# Patient Record
Sex: Female | Born: 1935 | Race: White | Hispanic: No | Marital: Married
Health system: Southern US, Community
[De-identification: ages and names within clinical notes are randomized; demographics above are authoritative.]

## PROBLEM LIST (undated history)

## (undated) DIAGNOSIS — I10 Essential (primary) hypertension: Secondary | ICD-10-CM

## (undated) DIAGNOSIS — I252 Old myocardial infarction: Secondary | ICD-10-CM

## (undated) HISTORY — PX: CARDIAC SURGERY: SHX584

## (undated) HISTORY — PX: PACEMAKER INSERTION: SHX728

---

## 2015-11-01 DIAGNOSIS — R42 Dizziness and giddiness: Secondary | ICD-10-CM | POA: Diagnosis not present

## 2015-11-01 DIAGNOSIS — F0280 Dementia in other diseases classified elsewhere without behavioral disturbance: Secondary | ICD-10-CM | POA: Diagnosis not present

## 2015-11-01 DIAGNOSIS — I10 Essential (primary) hypertension: Secondary | ICD-10-CM | POA: Diagnosis not present

## 2015-11-01 DIAGNOSIS — G300 Alzheimer's disease with early onset: Secondary | ICD-10-CM | POA: Diagnosis not present

## 2015-11-20 DIAGNOSIS — I498 Other specified cardiac arrhythmias: Secondary | ICD-10-CM | POA: Diagnosis not present

## 2015-11-20 DIAGNOSIS — Z45010 Encounter for checking and testing of cardiac pacemaker pulse generator [battery]: Secondary | ICD-10-CM | POA: Diagnosis not present

## 2016-01-30 DIAGNOSIS — G300 Alzheimer's disease with early onset: Secondary | ICD-10-CM | POA: Diagnosis not present

## 2016-01-30 DIAGNOSIS — I10 Essential (primary) hypertension: Secondary | ICD-10-CM | POA: Diagnosis not present

## 2016-01-30 DIAGNOSIS — R05 Cough: Secondary | ICD-10-CM | POA: Diagnosis not present

## 2016-01-30 DIAGNOSIS — F0280 Dementia in other diseases classified elsewhere without behavioral disturbance: Secondary | ICD-10-CM | POA: Diagnosis not present

## 2016-02-12 DIAGNOSIS — I495 Sick sinus syndrome: Secondary | ICD-10-CM | POA: Diagnosis not present

## 2016-02-12 DIAGNOSIS — Z950 Presence of cardiac pacemaker: Secondary | ICD-10-CM | POA: Diagnosis not present

## 2016-02-12 DIAGNOSIS — I10 Essential (primary) hypertension: Secondary | ICD-10-CM | POA: Diagnosis not present

## 2016-02-12 DIAGNOSIS — Z951 Presence of aortocoronary bypass graft: Secondary | ICD-10-CM | POA: Diagnosis not present

## 2016-02-12 DIAGNOSIS — Z952 Presence of prosthetic heart valve: Secondary | ICD-10-CM | POA: Diagnosis not present

## 2016-02-19 DIAGNOSIS — Z950 Presence of cardiac pacemaker: Secondary | ICD-10-CM | POA: Diagnosis not present

## 2016-02-28 DIAGNOSIS — Z952 Presence of prosthetic heart valve: Secondary | ICD-10-CM | POA: Diagnosis not present

## 2016-03-12 DIAGNOSIS — H353131 Nonexudative age-related macular degeneration, bilateral, early dry stage: Secondary | ICD-10-CM | POA: Diagnosis not present

## 2016-03-12 DIAGNOSIS — H524 Presbyopia: Secondary | ICD-10-CM | POA: Diagnosis not present

## 2016-04-30 DIAGNOSIS — F0280 Dementia in other diseases classified elsewhere without behavioral disturbance: Secondary | ICD-10-CM | POA: Diagnosis not present

## 2016-04-30 DIAGNOSIS — G300 Alzheimer's disease with early onset: Secondary | ICD-10-CM | POA: Diagnosis not present

## 2016-04-30 DIAGNOSIS — I10 Essential (primary) hypertension: Secondary | ICD-10-CM | POA: Diagnosis not present

## 2016-04-30 DIAGNOSIS — M7989 Other specified soft tissue disorders: Secondary | ICD-10-CM | POA: Diagnosis not present

## 2016-05-01 DIAGNOSIS — M7989 Other specified soft tissue disorders: Secondary | ICD-10-CM | POA: Diagnosis not present

## 2016-05-03 DIAGNOSIS — M1712 Unilateral primary osteoarthritis, left knee: Secondary | ICD-10-CM | POA: Diagnosis not present

## 2016-05-21 DIAGNOSIS — Z950 Presence of cardiac pacemaker: Secondary | ICD-10-CM | POA: Diagnosis not present

## 2016-08-03 DIAGNOSIS — Z950 Presence of cardiac pacemaker: Secondary | ICD-10-CM | POA: Diagnosis not present

## 2016-08-03 DIAGNOSIS — Z79899 Other long term (current) drug therapy: Secondary | ICD-10-CM | POA: Diagnosis not present

## 2016-08-03 DIAGNOSIS — S72001A Fracture of unspecified part of neck of right femur, initial encounter for closed fracture: Secondary | ICD-10-CM | POA: Diagnosis not present

## 2016-08-03 DIAGNOSIS — W19XXXA Unspecified fall, initial encounter: Secondary | ICD-10-CM | POA: Diagnosis not present

## 2016-08-03 DIAGNOSIS — I480 Paroxysmal atrial fibrillation: Secondary | ICD-10-CM | POA: Diagnosis not present

## 2016-08-03 DIAGNOSIS — G609 Hereditary and idiopathic neuropathy, unspecified: Secondary | ICD-10-CM | POA: Diagnosis not present

## 2016-08-03 DIAGNOSIS — Z951 Presence of aortocoronary bypass graft: Secondary | ICD-10-CM | POA: Diagnosis not present

## 2016-08-03 DIAGNOSIS — I2510 Atherosclerotic heart disease of native coronary artery without angina pectoris: Secondary | ICD-10-CM | POA: Diagnosis not present

## 2016-08-03 DIAGNOSIS — F0390 Unspecified dementia without behavioral disturbance: Secondary | ICD-10-CM | POA: Diagnosis not present

## 2016-08-03 DIAGNOSIS — E871 Hypo-osmolality and hyponatremia: Secondary | ICD-10-CM | POA: Diagnosis not present

## 2016-08-03 DIAGNOSIS — Z952 Presence of prosthetic heart valve: Secondary | ICD-10-CM | POA: Diagnosis not present

## 2016-08-03 DIAGNOSIS — S72142A Displaced intertrochanteric fracture of left femur, initial encounter for closed fracture: Secondary | ICD-10-CM | POA: Diagnosis not present

## 2016-08-03 DIAGNOSIS — E860 Dehydration: Secondary | ICD-10-CM | POA: Diagnosis not present

## 2016-08-03 DIAGNOSIS — K219 Gastro-esophageal reflux disease without esophagitis: Secondary | ICD-10-CM | POA: Diagnosis not present

## 2016-08-03 DIAGNOSIS — S7292XA Unspecified fracture of left femur, initial encounter for closed fracture: Secondary | ICD-10-CM | POA: Diagnosis not present

## 2016-08-03 DIAGNOSIS — M159 Polyosteoarthritis, unspecified: Secondary | ICD-10-CM | POA: Diagnosis not present

## 2016-08-03 DIAGNOSIS — M25552 Pain in left hip: Secondary | ICD-10-CM | POA: Diagnosis not present

## 2016-08-03 DIAGNOSIS — H9193 Unspecified hearing loss, bilateral: Secondary | ICD-10-CM | POA: Diagnosis not present

## 2016-08-03 DIAGNOSIS — I10 Essential (primary) hypertension: Secondary | ICD-10-CM | POA: Diagnosis not present

## 2016-08-03 DIAGNOSIS — Z780 Asymptomatic menopausal state: Secondary | ICD-10-CM | POA: Diagnosis not present

## 2016-08-03 DIAGNOSIS — E785 Hyperlipidemia, unspecified: Secondary | ICD-10-CM | POA: Diagnosis not present

## 2016-08-03 DIAGNOSIS — D62 Acute posthemorrhagic anemia: Secondary | ICD-10-CM | POA: Diagnosis not present

## 2016-08-03 DIAGNOSIS — S0990XA Unspecified injury of head, initial encounter: Secondary | ICD-10-CM | POA: Diagnosis not present

## 2016-08-03 DIAGNOSIS — W01198A Fall on same level from slipping, tripping and stumbling with subsequent striking against other object, initial encounter: Secondary | ICD-10-CM | POA: Diagnosis not present

## 2016-08-03 DIAGNOSIS — I517 Cardiomegaly: Secondary | ICD-10-CM | POA: Diagnosis not present

## 2016-08-04 DIAGNOSIS — I10 Essential (primary) hypertension: Secondary | ICD-10-CM | POA: Diagnosis not present

## 2016-08-04 DIAGNOSIS — D72829 Elevated white blood cell count, unspecified: Secondary | ICD-10-CM | POA: Diagnosis not present

## 2016-08-04 DIAGNOSIS — S7292XA Unspecified fracture of left femur, initial encounter for closed fracture: Secondary | ICD-10-CM | POA: Diagnosis not present

## 2016-08-04 DIAGNOSIS — F0390 Unspecified dementia without behavioral disturbance: Secondary | ICD-10-CM | POA: Diagnosis not present

## 2016-08-04 DIAGNOSIS — E871 Hypo-osmolality and hyponatremia: Secondary | ICD-10-CM | POA: Diagnosis not present

## 2016-08-04 DIAGNOSIS — S72142A Displaced intertrochanteric fracture of left femur, initial encounter for closed fracture: Secondary | ICD-10-CM | POA: Diagnosis not present

## 2016-08-05 DIAGNOSIS — D62 Acute posthemorrhagic anemia: Secondary | ICD-10-CM | POA: Diagnosis not present

## 2016-08-05 DIAGNOSIS — M79605 Pain in left leg: Secondary | ICD-10-CM | POA: Diagnosis not present

## 2016-08-05 DIAGNOSIS — F0390 Unspecified dementia without behavioral disturbance: Secondary | ICD-10-CM | POA: Diagnosis not present

## 2016-08-05 DIAGNOSIS — E871 Hypo-osmolality and hyponatremia: Secondary | ICD-10-CM | POA: Diagnosis not present

## 2016-08-05 DIAGNOSIS — S7292XA Unspecified fracture of left femur, initial encounter for closed fracture: Secondary | ICD-10-CM | POA: Diagnosis not present

## 2016-08-05 DIAGNOSIS — I10 Essential (primary) hypertension: Secondary | ICD-10-CM | POA: Diagnosis not present

## 2016-08-06 DIAGNOSIS — Z950 Presence of cardiac pacemaker: Secondary | ICD-10-CM | POA: Diagnosis not present

## 2016-08-06 DIAGNOSIS — Z4789 Encounter for other orthopedic aftercare: Secondary | ICD-10-CM | POA: Diagnosis not present

## 2016-08-06 DIAGNOSIS — S72142A Displaced intertrochanteric fracture of left femur, initial encounter for closed fracture: Secondary | ICD-10-CM | POA: Diagnosis not present

## 2016-08-06 DIAGNOSIS — E871 Hypo-osmolality and hyponatremia: Secondary | ICD-10-CM | POA: Diagnosis not present

## 2016-08-06 DIAGNOSIS — E860 Dehydration: Secondary | ICD-10-CM | POA: Diagnosis not present

## 2016-08-06 DIAGNOSIS — D72829 Elevated white blood cell count, unspecified: Secondary | ICD-10-CM | POA: Diagnosis not present

## 2016-08-06 DIAGNOSIS — G309 Alzheimer's disease, unspecified: Secondary | ICD-10-CM | POA: Diagnosis not present

## 2016-08-06 DIAGNOSIS — F0280 Dementia in other diseases classified elsewhere without behavioral disturbance: Secondary | ICD-10-CM | POA: Diagnosis not present

## 2016-08-06 DIAGNOSIS — Z951 Presence of aortocoronary bypass graft: Secondary | ICD-10-CM | POA: Diagnosis not present

## 2016-08-06 DIAGNOSIS — K219 Gastro-esophageal reflux disease without esophagitis: Secondary | ICD-10-CM | POA: Diagnosis not present

## 2016-08-06 DIAGNOSIS — D62 Acute posthemorrhagic anemia: Secondary | ICD-10-CM | POA: Diagnosis not present

## 2016-08-06 DIAGNOSIS — Z952 Presence of prosthetic heart valve: Secondary | ICD-10-CM | POA: Diagnosis not present

## 2016-08-06 DIAGNOSIS — I10 Essential (primary) hypertension: Secondary | ICD-10-CM | POA: Diagnosis not present

## 2016-08-06 DIAGNOSIS — S7292XA Unspecified fracture of left femur, initial encounter for closed fracture: Secondary | ICD-10-CM | POA: Diagnosis not present

## 2016-08-07 DIAGNOSIS — I10 Essential (primary) hypertension: Secondary | ICD-10-CM | POA: Diagnosis not present

## 2016-08-07 DIAGNOSIS — D72829 Elevated white blood cell count, unspecified: Secondary | ICD-10-CM | POA: Diagnosis not present

## 2016-08-07 DIAGNOSIS — F0280 Dementia in other diseases classified elsewhere without behavioral disturbance: Secondary | ICD-10-CM | POA: Diagnosis not present

## 2016-08-07 DIAGNOSIS — D62 Acute posthemorrhagic anemia: Secondary | ICD-10-CM | POA: Diagnosis not present

## 2016-08-07 DIAGNOSIS — S7292XA Unspecified fracture of left femur, initial encounter for closed fracture: Secondary | ICD-10-CM | POA: Diagnosis not present

## 2016-08-07 DIAGNOSIS — E871 Hypo-osmolality and hyponatremia: Secondary | ICD-10-CM | POA: Diagnosis not present

## 2016-08-07 DIAGNOSIS — G309 Alzheimer's disease, unspecified: Secondary | ICD-10-CM | POA: Diagnosis not present

## 2016-08-08 DIAGNOSIS — D72829 Elevated white blood cell count, unspecified: Secondary | ICD-10-CM | POA: Diagnosis not present

## 2016-08-08 DIAGNOSIS — G309 Alzheimer's disease, unspecified: Secondary | ICD-10-CM | POA: Diagnosis not present

## 2016-08-08 DIAGNOSIS — E871 Hypo-osmolality and hyponatremia: Secondary | ICD-10-CM | POA: Diagnosis not present

## 2016-08-08 DIAGNOSIS — Z951 Presence of aortocoronary bypass graft: Secondary | ICD-10-CM | POA: Diagnosis not present

## 2016-08-08 DIAGNOSIS — Z952 Presence of prosthetic heart valve: Secondary | ICD-10-CM | POA: Diagnosis not present

## 2016-08-08 DIAGNOSIS — Z950 Presence of cardiac pacemaker: Secondary | ICD-10-CM | POA: Diagnosis not present

## 2016-08-08 DIAGNOSIS — I10 Essential (primary) hypertension: Secondary | ICD-10-CM | POA: Diagnosis not present

## 2016-08-08 DIAGNOSIS — D62 Acute posthemorrhagic anemia: Secondary | ICD-10-CM | POA: Diagnosis not present

## 2016-08-08 DIAGNOSIS — S7292XA Unspecified fracture of left femur, initial encounter for closed fracture: Secondary | ICD-10-CM | POA: Diagnosis not present

## 2016-08-08 DIAGNOSIS — I4891 Unspecified atrial fibrillation: Secondary | ICD-10-CM | POA: Diagnosis not present

## 2016-08-08 DIAGNOSIS — F0280 Dementia in other diseases classified elsewhere without behavioral disturbance: Secondary | ICD-10-CM | POA: Diagnosis not present

## 2016-08-09 DIAGNOSIS — I4891 Unspecified atrial fibrillation: Secondary | ICD-10-CM | POA: Diagnosis not present

## 2016-08-09 DIAGNOSIS — D72829 Elevated white blood cell count, unspecified: Secondary | ICD-10-CM | POA: Diagnosis not present

## 2016-08-09 DIAGNOSIS — K219 Gastro-esophageal reflux disease without esophagitis: Secondary | ICD-10-CM | POA: Diagnosis not present

## 2016-08-09 DIAGNOSIS — Z950 Presence of cardiac pacemaker: Secondary | ICD-10-CM | POA: Diagnosis not present

## 2016-08-09 DIAGNOSIS — I10 Essential (primary) hypertension: Secondary | ICD-10-CM | POA: Diagnosis not present

## 2016-08-09 DIAGNOSIS — G309 Alzheimer's disease, unspecified: Secondary | ICD-10-CM | POA: Diagnosis not present

## 2016-08-09 DIAGNOSIS — D649 Anemia, unspecified: Secondary | ICD-10-CM | POA: Diagnosis not present

## 2016-08-09 DIAGNOSIS — Z952 Presence of prosthetic heart valve: Secondary | ICD-10-CM | POA: Diagnosis not present

## 2016-08-09 DIAGNOSIS — S72002D Fracture of unspecified part of neck of left femur, subsequent encounter for closed fracture with routine healing: Secondary | ICD-10-CM | POA: Diagnosis not present

## 2016-08-09 DIAGNOSIS — E871 Hypo-osmolality and hyponatremia: Secondary | ICD-10-CM | POA: Diagnosis not present

## 2016-08-09 DIAGNOSIS — Z9181 History of falling: Secondary | ICD-10-CM | POA: Diagnosis not present

## 2016-08-09 DIAGNOSIS — F0280 Dementia in other diseases classified elsewhere without behavioral disturbance: Secondary | ICD-10-CM | POA: Diagnosis not present

## 2016-08-09 DIAGNOSIS — S7292XA Unspecified fracture of left femur, initial encounter for closed fracture: Secondary | ICD-10-CM | POA: Diagnosis not present

## 2016-08-09 DIAGNOSIS — I2510 Atherosclerotic heart disease of native coronary artery without angina pectoris: Secondary | ICD-10-CM | POA: Diagnosis not present

## 2016-08-09 DIAGNOSIS — E785 Hyperlipidemia, unspecified: Secondary | ICD-10-CM | POA: Diagnosis not present

## 2016-08-09 DIAGNOSIS — D62 Acute posthemorrhagic anemia: Secondary | ICD-10-CM | POA: Diagnosis not present

## 2016-08-09 DIAGNOSIS — H905 Unspecified sensorineural hearing loss: Secondary | ICD-10-CM | POA: Diagnosis not present

## 2016-08-09 DIAGNOSIS — Z951 Presence of aortocoronary bypass graft: Secondary | ICD-10-CM | POA: Diagnosis not present

## 2016-08-09 DIAGNOSIS — S728X9A Other fracture of unspecified femur, initial encounter for closed fracture: Secondary | ICD-10-CM | POA: Diagnosis not present

## 2016-08-09 DIAGNOSIS — F0390 Unspecified dementia without behavioral disturbance: Secondary | ICD-10-CM | POA: Diagnosis not present

## 2016-08-09 DIAGNOSIS — R42 Dizziness and giddiness: Secondary | ICD-10-CM | POA: Diagnosis not present

## 2016-08-14 DIAGNOSIS — R262 Difficulty in walking, not elsewhere classified: Secondary | ICD-10-CM | POA: Diagnosis not present

## 2016-08-14 DIAGNOSIS — S7292XD Unspecified fracture of left femur, subsequent encounter for closed fracture with routine healing: Secondary | ICD-10-CM | POA: Diagnosis not present

## 2016-08-14 DIAGNOSIS — R42 Dizziness and giddiness: Secondary | ICD-10-CM | POA: Diagnosis not present

## 2016-08-14 DIAGNOSIS — H905 Unspecified sensorineural hearing loss: Secondary | ICD-10-CM | POA: Diagnosis not present

## 2016-08-14 DIAGNOSIS — I2510 Atherosclerotic heart disease of native coronary artery without angina pectoris: Secondary | ICD-10-CM | POA: Diagnosis not present

## 2016-08-14 DIAGNOSIS — Z9181 History of falling: Secondary | ICD-10-CM | POA: Diagnosis not present

## 2016-08-14 DIAGNOSIS — S72002D Fracture of unspecified part of neck of left femur, subsequent encounter for closed fracture with routine healing: Secondary | ICD-10-CM | POA: Diagnosis not present

## 2016-08-14 DIAGNOSIS — K219 Gastro-esophageal reflux disease without esophagitis: Secondary | ICD-10-CM | POA: Diagnosis not present

## 2016-08-14 DIAGNOSIS — F0390 Unspecified dementia without behavioral disturbance: Secondary | ICD-10-CM | POA: Diagnosis not present

## 2016-08-14 DIAGNOSIS — I10 Essential (primary) hypertension: Secondary | ICD-10-CM | POA: Diagnosis not present

## 2016-08-14 DIAGNOSIS — Z951 Presence of aortocoronary bypass graft: Secondary | ICD-10-CM | POA: Diagnosis not present

## 2016-08-14 DIAGNOSIS — Z950 Presence of cardiac pacemaker: Secondary | ICD-10-CM | POA: Diagnosis not present

## 2016-08-14 DIAGNOSIS — E785 Hyperlipidemia, unspecified: Secondary | ICD-10-CM | POA: Diagnosis not present

## 2016-08-19 DIAGNOSIS — Z950 Presence of cardiac pacemaker: Secondary | ICD-10-CM | POA: Diagnosis not present

## 2016-08-19 DIAGNOSIS — Z4789 Encounter for other orthopedic aftercare: Secondary | ICD-10-CM | POA: Diagnosis not present

## 2016-08-20 DIAGNOSIS — M791 Myalgia: Secondary | ICD-10-CM | POA: Diagnosis not present

## 2016-08-20 DIAGNOSIS — R2689 Other abnormalities of gait and mobility: Secondary | ICD-10-CM | POA: Diagnosis not present

## 2016-08-20 DIAGNOSIS — M461 Sacroiliitis, not elsewhere classified: Secondary | ICD-10-CM | POA: Diagnosis not present

## 2016-08-30 DIAGNOSIS — Z9181 History of falling: Secondary | ICD-10-CM | POA: Diagnosis not present

## 2016-08-30 DIAGNOSIS — S72002D Fracture of unspecified part of neck of left femur, subsequent encounter for closed fracture with routine healing: Secondary | ICD-10-CM | POA: Diagnosis not present

## 2016-08-31 DIAGNOSIS — Z79891 Long term (current) use of opiate analgesic: Secondary | ICD-10-CM | POA: Diagnosis not present

## 2016-08-31 DIAGNOSIS — I10 Essential (primary) hypertension: Secondary | ICD-10-CM | POA: Diagnosis not present

## 2016-08-31 DIAGNOSIS — Z9181 History of falling: Secondary | ICD-10-CM | POA: Diagnosis not present

## 2016-08-31 DIAGNOSIS — Z951 Presence of aortocoronary bypass graft: Secondary | ICD-10-CM | POA: Diagnosis not present

## 2016-08-31 DIAGNOSIS — G609 Hereditary and idiopathic neuropathy, unspecified: Secondary | ICD-10-CM | POA: Diagnosis not present

## 2016-08-31 DIAGNOSIS — W19XXXD Unspecified fall, subsequent encounter: Secondary | ICD-10-CM | POA: Diagnosis not present

## 2016-08-31 DIAGNOSIS — I2510 Atherosclerotic heart disease of native coronary artery without angina pectoris: Secondary | ICD-10-CM | POA: Diagnosis not present

## 2016-08-31 DIAGNOSIS — F0390 Unspecified dementia without behavioral disturbance: Secondary | ICD-10-CM | POA: Diagnosis not present

## 2016-08-31 DIAGNOSIS — I480 Paroxysmal atrial fibrillation: Secondary | ICD-10-CM | POA: Diagnosis not present

## 2016-08-31 DIAGNOSIS — M150 Primary generalized (osteo)arthritis: Secondary | ICD-10-CM | POA: Diagnosis not present

## 2016-08-31 DIAGNOSIS — H9193 Unspecified hearing loss, bilateral: Secondary | ICD-10-CM | POA: Diagnosis not present

## 2016-08-31 DIAGNOSIS — S72142D Displaced intertrochanteric fracture of left femur, subsequent encounter for closed fracture with routine healing: Secondary | ICD-10-CM | POA: Diagnosis not present

## 2016-09-09 DIAGNOSIS — Z951 Presence of aortocoronary bypass graft: Secondary | ICD-10-CM | POA: Diagnosis not present

## 2016-09-09 DIAGNOSIS — G609 Hereditary and idiopathic neuropathy, unspecified: Secondary | ICD-10-CM | POA: Diagnosis not present

## 2016-09-09 DIAGNOSIS — I480 Paroxysmal atrial fibrillation: Secondary | ICD-10-CM | POA: Diagnosis not present

## 2016-09-09 DIAGNOSIS — I2510 Atherosclerotic heart disease of native coronary artery without angina pectoris: Secondary | ICD-10-CM | POA: Diagnosis not present

## 2016-09-09 DIAGNOSIS — Z9181 History of falling: Secondary | ICD-10-CM | POA: Diagnosis not present

## 2016-09-09 DIAGNOSIS — W19XXXD Unspecified fall, subsequent encounter: Secondary | ICD-10-CM | POA: Diagnosis not present

## 2016-09-09 DIAGNOSIS — I10 Essential (primary) hypertension: Secondary | ICD-10-CM | POA: Diagnosis not present

## 2016-09-09 DIAGNOSIS — Z79891 Long term (current) use of opiate analgesic: Secondary | ICD-10-CM | POA: Diagnosis not present

## 2016-09-09 DIAGNOSIS — F0390 Unspecified dementia without behavioral disturbance: Secondary | ICD-10-CM | POA: Diagnosis not present

## 2016-09-09 DIAGNOSIS — M150 Primary generalized (osteo)arthritis: Secondary | ICD-10-CM | POA: Diagnosis not present

## 2016-09-09 DIAGNOSIS — H9193 Unspecified hearing loss, bilateral: Secondary | ICD-10-CM | POA: Diagnosis not present

## 2016-09-09 DIAGNOSIS — S72142D Displaced intertrochanteric fracture of left femur, subsequent encounter for closed fracture with routine healing: Secondary | ICD-10-CM | POA: Diagnosis not present

## 2016-09-11 DIAGNOSIS — I10 Essential (primary) hypertension: Secondary | ICD-10-CM | POA: Diagnosis not present

## 2016-09-11 DIAGNOSIS — I480 Paroxysmal atrial fibrillation: Secondary | ICD-10-CM | POA: Diagnosis not present

## 2016-09-11 DIAGNOSIS — S72142A Displaced intertrochanteric fracture of left femur, initial encounter for closed fracture: Secondary | ICD-10-CM | POA: Diagnosis not present

## 2016-09-11 DIAGNOSIS — D62 Acute posthemorrhagic anemia: Secondary | ICD-10-CM | POA: Diagnosis not present

## 2016-09-11 DIAGNOSIS — Z23 Encounter for immunization: Secondary | ICD-10-CM | POA: Diagnosis not present

## 2016-09-18 DIAGNOSIS — Z4789 Encounter for other orthopedic aftercare: Secondary | ICD-10-CM | POA: Diagnosis not present

## 2016-09-20 DIAGNOSIS — I10 Essential (primary) hypertension: Secondary | ICD-10-CM | POA: Diagnosis not present

## 2016-09-20 DIAGNOSIS — M150 Primary generalized (osteo)arthritis: Secondary | ICD-10-CM | POA: Diagnosis not present

## 2016-09-20 DIAGNOSIS — S72142D Displaced intertrochanteric fracture of left femur, subsequent encounter for closed fracture with routine healing: Secondary | ICD-10-CM | POA: Diagnosis not present

## 2016-09-20 DIAGNOSIS — Z951 Presence of aortocoronary bypass graft: Secondary | ICD-10-CM | POA: Diagnosis not present

## 2016-09-20 DIAGNOSIS — W19XXXD Unspecified fall, subsequent encounter: Secondary | ICD-10-CM | POA: Diagnosis not present

## 2016-09-20 DIAGNOSIS — F0390 Unspecified dementia without behavioral disturbance: Secondary | ICD-10-CM | POA: Diagnosis not present

## 2016-09-20 DIAGNOSIS — Z9181 History of falling: Secondary | ICD-10-CM | POA: Diagnosis not present

## 2016-09-20 DIAGNOSIS — H9193 Unspecified hearing loss, bilateral: Secondary | ICD-10-CM | POA: Diagnosis not present

## 2016-09-20 DIAGNOSIS — Z79891 Long term (current) use of opiate analgesic: Secondary | ICD-10-CM | POA: Diagnosis not present

## 2016-09-20 DIAGNOSIS — G609 Hereditary and idiopathic neuropathy, unspecified: Secondary | ICD-10-CM | POA: Diagnosis not present

## 2016-09-20 DIAGNOSIS — I2510 Atherosclerotic heart disease of native coronary artery without angina pectoris: Secondary | ICD-10-CM | POA: Diagnosis not present

## 2016-09-20 DIAGNOSIS — I480 Paroxysmal atrial fibrillation: Secondary | ICD-10-CM | POA: Diagnosis not present

## 2016-09-23 DIAGNOSIS — I2510 Atherosclerotic heart disease of native coronary artery without angina pectoris: Secondary | ICD-10-CM | POA: Diagnosis not present

## 2016-09-23 DIAGNOSIS — I10 Essential (primary) hypertension: Secondary | ICD-10-CM | POA: Diagnosis not present

## 2016-09-23 DIAGNOSIS — H9193 Unspecified hearing loss, bilateral: Secondary | ICD-10-CM | POA: Diagnosis not present

## 2016-09-23 DIAGNOSIS — Z9181 History of falling: Secondary | ICD-10-CM | POA: Diagnosis not present

## 2016-09-23 DIAGNOSIS — S72142D Displaced intertrochanteric fracture of left femur, subsequent encounter for closed fracture with routine healing: Secondary | ICD-10-CM | POA: Diagnosis not present

## 2016-09-23 DIAGNOSIS — F0390 Unspecified dementia without behavioral disturbance: Secondary | ICD-10-CM | POA: Diagnosis not present

## 2016-09-23 DIAGNOSIS — W19XXXD Unspecified fall, subsequent encounter: Secondary | ICD-10-CM | POA: Diagnosis not present

## 2016-09-23 DIAGNOSIS — I480 Paroxysmal atrial fibrillation: Secondary | ICD-10-CM | POA: Diagnosis not present

## 2016-09-23 DIAGNOSIS — M150 Primary generalized (osteo)arthritis: Secondary | ICD-10-CM | POA: Diagnosis not present

## 2016-09-23 DIAGNOSIS — Z79891 Long term (current) use of opiate analgesic: Secondary | ICD-10-CM | POA: Diagnosis not present

## 2016-09-23 DIAGNOSIS — G609 Hereditary and idiopathic neuropathy, unspecified: Secondary | ICD-10-CM | POA: Diagnosis not present

## 2016-09-23 DIAGNOSIS — Z951 Presence of aortocoronary bypass graft: Secondary | ICD-10-CM | POA: Diagnosis not present

## 2016-10-01 DIAGNOSIS — Z9181 History of falling: Secondary | ICD-10-CM | POA: Diagnosis not present

## 2016-10-01 DIAGNOSIS — H9193 Unspecified hearing loss, bilateral: Secondary | ICD-10-CM | POA: Diagnosis not present

## 2016-10-01 DIAGNOSIS — G609 Hereditary and idiopathic neuropathy, unspecified: Secondary | ICD-10-CM | POA: Diagnosis not present

## 2016-10-01 DIAGNOSIS — I10 Essential (primary) hypertension: Secondary | ICD-10-CM | POA: Diagnosis not present

## 2016-10-01 DIAGNOSIS — S72142D Displaced intertrochanteric fracture of left femur, subsequent encounter for closed fracture with routine healing: Secondary | ICD-10-CM | POA: Diagnosis not present

## 2016-10-01 DIAGNOSIS — Z79891 Long term (current) use of opiate analgesic: Secondary | ICD-10-CM | POA: Diagnosis not present

## 2016-10-01 DIAGNOSIS — W19XXXD Unspecified fall, subsequent encounter: Secondary | ICD-10-CM | POA: Diagnosis not present

## 2016-10-01 DIAGNOSIS — I2510 Atherosclerotic heart disease of native coronary artery without angina pectoris: Secondary | ICD-10-CM | POA: Diagnosis not present

## 2016-10-01 DIAGNOSIS — Z951 Presence of aortocoronary bypass graft: Secondary | ICD-10-CM | POA: Diagnosis not present

## 2016-10-01 DIAGNOSIS — F0390 Unspecified dementia without behavioral disturbance: Secondary | ICD-10-CM | POA: Diagnosis not present

## 2016-10-01 DIAGNOSIS — I480 Paroxysmal atrial fibrillation: Secondary | ICD-10-CM | POA: Diagnosis not present

## 2016-10-01 DIAGNOSIS — M150 Primary generalized (osteo)arthritis: Secondary | ICD-10-CM | POA: Diagnosis not present

## 2016-10-02 DIAGNOSIS — S72142D Displaced intertrochanteric fracture of left femur, subsequent encounter for closed fracture with routine healing: Secondary | ICD-10-CM | POA: Diagnosis not present

## 2016-10-02 DIAGNOSIS — M150 Primary generalized (osteo)arthritis: Secondary | ICD-10-CM | POA: Diagnosis not present

## 2016-10-02 DIAGNOSIS — F0390 Unspecified dementia without behavioral disturbance: Secondary | ICD-10-CM | POA: Diagnosis not present

## 2016-10-02 DIAGNOSIS — I10 Essential (primary) hypertension: Secondary | ICD-10-CM | POA: Diagnosis not present

## 2016-10-02 DIAGNOSIS — I480 Paroxysmal atrial fibrillation: Secondary | ICD-10-CM | POA: Diagnosis not present

## 2016-10-02 DIAGNOSIS — G609 Hereditary and idiopathic neuropathy, unspecified: Secondary | ICD-10-CM | POA: Diagnosis not present

## 2016-10-10 DIAGNOSIS — I480 Paroxysmal atrial fibrillation: Secondary | ICD-10-CM | POA: Diagnosis not present

## 2016-10-10 DIAGNOSIS — Z9181 History of falling: Secondary | ICD-10-CM | POA: Diagnosis not present

## 2016-10-10 DIAGNOSIS — I10 Essential (primary) hypertension: Secondary | ICD-10-CM | POA: Diagnosis not present

## 2016-10-10 DIAGNOSIS — I2510 Atherosclerotic heart disease of native coronary artery without angina pectoris: Secondary | ICD-10-CM | POA: Diagnosis not present

## 2016-10-10 DIAGNOSIS — M150 Primary generalized (osteo)arthritis: Secondary | ICD-10-CM | POA: Diagnosis not present

## 2016-10-10 DIAGNOSIS — S72142D Displaced intertrochanteric fracture of left femur, subsequent encounter for closed fracture with routine healing: Secondary | ICD-10-CM | POA: Diagnosis not present

## 2016-10-10 DIAGNOSIS — Z951 Presence of aortocoronary bypass graft: Secondary | ICD-10-CM | POA: Diagnosis not present

## 2016-10-10 DIAGNOSIS — H9193 Unspecified hearing loss, bilateral: Secondary | ICD-10-CM | POA: Diagnosis not present

## 2016-10-10 DIAGNOSIS — G609 Hereditary and idiopathic neuropathy, unspecified: Secondary | ICD-10-CM | POA: Diagnosis not present

## 2016-10-10 DIAGNOSIS — W19XXXD Unspecified fall, subsequent encounter: Secondary | ICD-10-CM | POA: Diagnosis not present

## 2016-10-10 DIAGNOSIS — Z79891 Long term (current) use of opiate analgesic: Secondary | ICD-10-CM | POA: Diagnosis not present

## 2016-10-10 DIAGNOSIS — F0390 Unspecified dementia without behavioral disturbance: Secondary | ICD-10-CM | POA: Diagnosis not present

## 2016-10-16 DIAGNOSIS — S72142D Displaced intertrochanteric fracture of left femur, subsequent encounter for closed fracture with routine healing: Secondary | ICD-10-CM | POA: Diagnosis not present

## 2016-10-16 DIAGNOSIS — Z951 Presence of aortocoronary bypass graft: Secondary | ICD-10-CM | POA: Diagnosis not present

## 2016-10-16 DIAGNOSIS — Z9181 History of falling: Secondary | ICD-10-CM | POA: Diagnosis not present

## 2016-10-16 DIAGNOSIS — I2510 Atherosclerotic heart disease of native coronary artery without angina pectoris: Secondary | ICD-10-CM | POA: Diagnosis not present

## 2016-10-16 DIAGNOSIS — F0390 Unspecified dementia without behavioral disturbance: Secondary | ICD-10-CM | POA: Diagnosis not present

## 2016-10-16 DIAGNOSIS — I480 Paroxysmal atrial fibrillation: Secondary | ICD-10-CM | POA: Diagnosis not present

## 2016-10-16 DIAGNOSIS — I10 Essential (primary) hypertension: Secondary | ICD-10-CM | POA: Diagnosis not present

## 2016-10-16 DIAGNOSIS — H9193 Unspecified hearing loss, bilateral: Secondary | ICD-10-CM | POA: Diagnosis not present

## 2016-10-16 DIAGNOSIS — G609 Hereditary and idiopathic neuropathy, unspecified: Secondary | ICD-10-CM | POA: Diagnosis not present

## 2016-10-16 DIAGNOSIS — M150 Primary generalized (osteo)arthritis: Secondary | ICD-10-CM | POA: Diagnosis not present

## 2016-10-16 DIAGNOSIS — Z79891 Long term (current) use of opiate analgesic: Secondary | ICD-10-CM | POA: Diagnosis not present

## 2016-10-16 DIAGNOSIS — W19XXXD Unspecified fall, subsequent encounter: Secondary | ICD-10-CM | POA: Diagnosis not present

## 2016-10-18 DIAGNOSIS — S72142D Displaced intertrochanteric fracture of left femur, subsequent encounter for closed fracture with routine healing: Secondary | ICD-10-CM | POA: Diagnosis not present

## 2016-10-18 DIAGNOSIS — I10 Essential (primary) hypertension: Secondary | ICD-10-CM | POA: Diagnosis not present

## 2016-10-18 DIAGNOSIS — M150 Primary generalized (osteo)arthritis: Secondary | ICD-10-CM | POA: Diagnosis not present

## 2016-10-18 DIAGNOSIS — G609 Hereditary and idiopathic neuropathy, unspecified: Secondary | ICD-10-CM | POA: Diagnosis not present

## 2016-10-18 DIAGNOSIS — F0390 Unspecified dementia without behavioral disturbance: Secondary | ICD-10-CM | POA: Diagnosis not present

## 2016-10-18 DIAGNOSIS — I480 Paroxysmal atrial fibrillation: Secondary | ICD-10-CM | POA: Diagnosis not present

## 2016-10-21 DIAGNOSIS — I2510 Atherosclerotic heart disease of native coronary artery without angina pectoris: Secondary | ICD-10-CM | POA: Diagnosis not present

## 2016-10-21 DIAGNOSIS — H9193 Unspecified hearing loss, bilateral: Secondary | ICD-10-CM | POA: Diagnosis not present

## 2016-10-21 DIAGNOSIS — F0390 Unspecified dementia without behavioral disturbance: Secondary | ICD-10-CM | POA: Diagnosis not present

## 2016-10-21 DIAGNOSIS — Z79891 Long term (current) use of opiate analgesic: Secondary | ICD-10-CM | POA: Diagnosis not present

## 2016-10-21 DIAGNOSIS — G609 Hereditary and idiopathic neuropathy, unspecified: Secondary | ICD-10-CM | POA: Diagnosis not present

## 2016-10-21 DIAGNOSIS — I480 Paroxysmal atrial fibrillation: Secondary | ICD-10-CM | POA: Diagnosis not present

## 2016-10-21 DIAGNOSIS — I10 Essential (primary) hypertension: Secondary | ICD-10-CM | POA: Diagnosis not present

## 2016-10-21 DIAGNOSIS — M150 Primary generalized (osteo)arthritis: Secondary | ICD-10-CM | POA: Diagnosis not present

## 2016-10-21 DIAGNOSIS — S72142D Displaced intertrochanteric fracture of left femur, subsequent encounter for closed fracture with routine healing: Secondary | ICD-10-CM | POA: Diagnosis not present

## 2016-10-21 DIAGNOSIS — Z9181 History of falling: Secondary | ICD-10-CM | POA: Diagnosis not present

## 2016-10-21 DIAGNOSIS — Z951 Presence of aortocoronary bypass graft: Secondary | ICD-10-CM | POA: Diagnosis not present

## 2016-10-21 DIAGNOSIS — W19XXXD Unspecified fall, subsequent encounter: Secondary | ICD-10-CM | POA: Diagnosis not present

## 2016-11-18 DIAGNOSIS — Z950 Presence of cardiac pacemaker: Secondary | ICD-10-CM | POA: Diagnosis not present

## 2016-12-16 DIAGNOSIS — I495 Sick sinus syndrome: Secondary | ICD-10-CM | POA: Diagnosis not present

## 2016-12-16 DIAGNOSIS — F0280 Dementia in other diseases classified elsewhere without behavioral disturbance: Secondary | ICD-10-CM | POA: Diagnosis not present

## 2016-12-16 DIAGNOSIS — G609 Hereditary and idiopathic neuropathy, unspecified: Secondary | ICD-10-CM | POA: Diagnosis not present

## 2016-12-16 DIAGNOSIS — I10 Essential (primary) hypertension: Secondary | ICD-10-CM | POA: Diagnosis not present

## 2016-12-16 DIAGNOSIS — G300 Alzheimer's disease with early onset: Secondary | ICD-10-CM | POA: Diagnosis not present

## 2016-12-16 DIAGNOSIS — Z950 Presence of cardiac pacemaker: Secondary | ICD-10-CM | POA: Diagnosis not present

## 2016-12-16 DIAGNOSIS — I480 Paroxysmal atrial fibrillation: Secondary | ICD-10-CM | POA: Diagnosis not present

## 2016-12-16 DIAGNOSIS — E782 Mixed hyperlipidemia: Secondary | ICD-10-CM | POA: Diagnosis not present

## 2016-12-16 DIAGNOSIS — Z0000 Encounter for general adult medical examination without abnormal findings: Secondary | ICD-10-CM | POA: Diagnosis not present

## 2016-12-16 DIAGNOSIS — R300 Dysuria: Secondary | ICD-10-CM | POA: Diagnosis not present

## 2016-12-25 DIAGNOSIS — R300 Dysuria: Secondary | ICD-10-CM | POA: Diagnosis not present

## 2017-01-29 DIAGNOSIS — H353131 Nonexudative age-related macular degeneration, bilateral, early dry stage: Secondary | ICD-10-CM | POA: Diagnosis not present

## 2017-01-29 DIAGNOSIS — H5213 Myopia, bilateral: Secondary | ICD-10-CM | POA: Diagnosis not present

## 2017-02-12 DIAGNOSIS — Z951 Presence of aortocoronary bypass graft: Secondary | ICD-10-CM | POA: Diagnosis not present

## 2017-02-12 DIAGNOSIS — I10 Essential (primary) hypertension: Secondary | ICD-10-CM | POA: Diagnosis not present

## 2017-02-12 DIAGNOSIS — Z8679 Personal history of other diseases of the circulatory system: Secondary | ICD-10-CM | POA: Diagnosis not present

## 2017-02-12 DIAGNOSIS — Z952 Presence of prosthetic heart valve: Secondary | ICD-10-CM | POA: Diagnosis not present

## 2017-02-12 DIAGNOSIS — I495 Sick sinus syndrome: Secondary | ICD-10-CM | POA: Diagnosis not present

## 2017-02-12 DIAGNOSIS — Z9889 Other specified postprocedural states: Secondary | ICD-10-CM | POA: Diagnosis not present

## 2017-02-12 DIAGNOSIS — Z950 Presence of cardiac pacemaker: Secondary | ICD-10-CM | POA: Diagnosis not present

## 2017-02-12 DIAGNOSIS — E785 Hyperlipidemia, unspecified: Secondary | ICD-10-CM | POA: Diagnosis not present

## 2017-02-18 DIAGNOSIS — H906 Mixed conductive and sensorineural hearing loss, bilateral: Secondary | ICD-10-CM | POA: Diagnosis not present

## 2017-02-18 DIAGNOSIS — G300 Alzheimer's disease with early onset: Secondary | ICD-10-CM | POA: Diagnosis not present

## 2017-02-18 DIAGNOSIS — M545 Low back pain: Secondary | ICD-10-CM | POA: Diagnosis not present

## 2017-02-18 DIAGNOSIS — M159 Polyosteoarthritis, unspecified: Secondary | ICD-10-CM | POA: Diagnosis not present

## 2017-02-18 DIAGNOSIS — F0280 Dementia in other diseases classified elsewhere without behavioral disturbance: Secondary | ICD-10-CM | POA: Diagnosis not present

## 2017-03-24 DIAGNOSIS — H9202 Otalgia, left ear: Secondary | ICD-10-CM | POA: Diagnosis not present

## 2017-03-24 DIAGNOSIS — H903 Sensorineural hearing loss, bilateral: Secondary | ICD-10-CM | POA: Diagnosis not present

## 2017-06-19 DIAGNOSIS — Z23 Encounter for immunization: Secondary | ICD-10-CM | POA: Diagnosis not present

## 2017-06-19 DIAGNOSIS — F0280 Dementia in other diseases classified elsewhere without behavioral disturbance: Secondary | ICD-10-CM | POA: Diagnosis not present

## 2017-06-19 DIAGNOSIS — I10 Essential (primary) hypertension: Secondary | ICD-10-CM | POA: Diagnosis not present

## 2017-06-19 DIAGNOSIS — G300 Alzheimer's disease with early onset: Secondary | ICD-10-CM | POA: Diagnosis not present

## 2017-08-01 DIAGNOSIS — I480 Paroxysmal atrial fibrillation: Secondary | ICD-10-CM | POA: Diagnosis not present

## 2017-08-01 DIAGNOSIS — I10 Essential (primary) hypertension: Secondary | ICD-10-CM | POA: Diagnosis not present

## 2017-08-01 DIAGNOSIS — R269 Unspecified abnormalities of gait and mobility: Secondary | ICD-10-CM | POA: Diagnosis not present

## 2017-08-01 DIAGNOSIS — M159 Polyosteoarthritis, unspecified: Secondary | ICD-10-CM | POA: Diagnosis not present

## 2017-08-01 DIAGNOSIS — G609 Hereditary and idiopathic neuropathy, unspecified: Secondary | ICD-10-CM | POA: Diagnosis not present

## 2017-08-29 DIAGNOSIS — Z950 Presence of cardiac pacemaker: Secondary | ICD-10-CM | POA: Diagnosis not present

## 2017-10-16 DIAGNOSIS — Z952 Presence of prosthetic heart valve: Secondary | ICD-10-CM | POA: Diagnosis not present

## 2017-10-16 DIAGNOSIS — I25810 Atherosclerosis of coronary artery bypass graft(s) without angina pectoris: Secondary | ICD-10-CM | POA: Diagnosis not present

## 2017-10-16 DIAGNOSIS — Z950 Presence of cardiac pacemaker: Secondary | ICD-10-CM | POA: Diagnosis not present

## 2017-10-16 DIAGNOSIS — E782 Mixed hyperlipidemia: Secondary | ICD-10-CM | POA: Diagnosis not present

## 2017-12-17 DIAGNOSIS — Z950 Presence of cardiac pacemaker: Secondary | ICD-10-CM | POA: Diagnosis not present

## 2017-12-22 DIAGNOSIS — I10 Essential (primary) hypertension: Secondary | ICD-10-CM | POA: Diagnosis not present

## 2017-12-22 DIAGNOSIS — E782 Mixed hyperlipidemia: Secondary | ICD-10-CM | POA: Diagnosis not present

## 2017-12-22 DIAGNOSIS — Z0000 Encounter for general adult medical examination without abnormal findings: Secondary | ICD-10-CM | POA: Diagnosis not present

## 2017-12-22 DIAGNOSIS — I495 Sick sinus syndrome: Secondary | ICD-10-CM | POA: Diagnosis not present

## 2017-12-22 DIAGNOSIS — N3001 Acute cystitis with hematuria: Secondary | ICD-10-CM | POA: Diagnosis not present

## 2017-12-22 DIAGNOSIS — I480 Paroxysmal atrial fibrillation: Secondary | ICD-10-CM | POA: Diagnosis not present

## 2018-02-27 DIAGNOSIS — J3489 Other specified disorders of nose and nasal sinuses: Secondary | ICD-10-CM | POA: Diagnosis not present

## 2018-02-27 DIAGNOSIS — H903 Sensorineural hearing loss, bilateral: Secondary | ICD-10-CM | POA: Diagnosis not present

## 2018-03-18 DIAGNOSIS — Z45010 Encounter for checking and testing of cardiac pacemaker pulse generator [battery]: Secondary | ICD-10-CM | POA: Diagnosis not present

## 2018-03-18 DIAGNOSIS — Z950 Presence of cardiac pacemaker: Secondary | ICD-10-CM | POA: Diagnosis not present

## 2018-04-15 DIAGNOSIS — H31012 Macula scars of posterior pole (postinflammatory) (post-traumatic), left eye: Secondary | ICD-10-CM | POA: Diagnosis not present

## 2018-04-15 DIAGNOSIS — H52203 Unspecified astigmatism, bilateral: Secondary | ICD-10-CM | POA: Diagnosis not present

## 2018-04-15 DIAGNOSIS — D3131 Benign neoplasm of right choroid: Secondary | ICD-10-CM | POA: Diagnosis not present

## 2018-04-15 DIAGNOSIS — H524 Presbyopia: Secondary | ICD-10-CM | POA: Diagnosis not present

## 2018-04-27 DIAGNOSIS — I10 Essential (primary) hypertension: Secondary | ICD-10-CM | POA: Diagnosis not present

## 2018-04-27 DIAGNOSIS — I480 Paroxysmal atrial fibrillation: Secondary | ICD-10-CM | POA: Diagnosis not present

## 2018-04-27 DIAGNOSIS — E782 Mixed hyperlipidemia: Secondary | ICD-10-CM | POA: Diagnosis not present

## 2018-04-27 DIAGNOSIS — Z952 Presence of prosthetic heart valve: Secondary | ICD-10-CM | POA: Diagnosis not present

## 2018-04-27 DIAGNOSIS — I495 Sick sinus syndrome: Secondary | ICD-10-CM | POA: Diagnosis not present

## 2018-04-27 DIAGNOSIS — Z951 Presence of aortocoronary bypass graft: Secondary | ICD-10-CM | POA: Diagnosis not present

## 2018-04-27 DIAGNOSIS — Z950 Presence of cardiac pacemaker: Secondary | ICD-10-CM | POA: Diagnosis not present

## 2018-04-27 DIAGNOSIS — I25810 Atherosclerosis of coronary artery bypass graft(s) without angina pectoris: Secondary | ICD-10-CM | POA: Diagnosis not present

## 2018-05-12 DIAGNOSIS — Z952 Presence of prosthetic heart valve: Secondary | ICD-10-CM | POA: Diagnosis not present

## 2018-05-26 DIAGNOSIS — Z950 Presence of cardiac pacemaker: Secondary | ICD-10-CM | POA: Diagnosis not present

## 2018-05-26 DIAGNOSIS — I495 Sick sinus syndrome: Secondary | ICD-10-CM | POA: Diagnosis not present

## 2018-06-23 DIAGNOSIS — H903 Sensorineural hearing loss, bilateral: Secondary | ICD-10-CM | POA: Diagnosis not present

## 2018-06-23 DIAGNOSIS — H6123 Impacted cerumen, bilateral: Secondary | ICD-10-CM | POA: Diagnosis not present

## 2018-06-30 DIAGNOSIS — E782 Mixed hyperlipidemia: Secondary | ICD-10-CM | POA: Diagnosis not present

## 2018-06-30 DIAGNOSIS — G300 Alzheimer's disease with early onset: Secondary | ICD-10-CM | POA: Diagnosis not present

## 2018-06-30 DIAGNOSIS — I480 Paroxysmal atrial fibrillation: Secondary | ICD-10-CM | POA: Diagnosis not present

## 2018-06-30 DIAGNOSIS — I10 Essential (primary) hypertension: Secondary | ICD-10-CM | POA: Diagnosis not present

## 2018-06-30 DIAGNOSIS — F0280 Dementia in other diseases classified elsewhere without behavioral disturbance: Secondary | ICD-10-CM | POA: Diagnosis not present

## 2018-07-20 DIAGNOSIS — R310 Gross hematuria: Secondary | ICD-10-CM | POA: Diagnosis not present

## 2018-08-25 DIAGNOSIS — Z950 Presence of cardiac pacemaker: Secondary | ICD-10-CM | POA: Diagnosis not present

## 2018-10-15 DIAGNOSIS — H903 Sensorineural hearing loss, bilateral: Secondary | ICD-10-CM | POA: Diagnosis not present

## 2018-10-15 DIAGNOSIS — H6121 Impacted cerumen, right ear: Secondary | ICD-10-CM | POA: Diagnosis not present

## 2018-10-15 DIAGNOSIS — J3489 Other specified disorders of nose and nasal sinuses: Secondary | ICD-10-CM | POA: Diagnosis not present

## 2018-10-15 DIAGNOSIS — Z974 Presence of external hearing-aid: Secondary | ICD-10-CM | POA: Diagnosis not present

## 2018-10-15 DIAGNOSIS — H6122 Impacted cerumen, left ear: Secondary | ICD-10-CM | POA: Diagnosis not present

## 2018-10-21 DIAGNOSIS — H353132 Nonexudative age-related macular degeneration, bilateral, intermediate dry stage: Secondary | ICD-10-CM | POA: Diagnosis not present

## 2018-10-21 DIAGNOSIS — H524 Presbyopia: Secondary | ICD-10-CM | POA: Diagnosis not present

## 2018-10-21 DIAGNOSIS — H3554 Dystrophies primarily involving the retinal pigment epithelium: Secondary | ICD-10-CM | POA: Diagnosis not present

## 2018-10-21 DIAGNOSIS — H35361 Drusen (degenerative) of macula, right eye: Secondary | ICD-10-CM | POA: Diagnosis not present

## 2018-10-21 DIAGNOSIS — H35433 Paving stone degeneration of retina, bilateral: Secondary | ICD-10-CM | POA: Diagnosis not present

## 2018-10-21 DIAGNOSIS — H31012 Macula scars of posterior pole (postinflammatory) (post-traumatic), left eye: Secondary | ICD-10-CM | POA: Diagnosis not present

## 2018-10-21 DIAGNOSIS — Z961 Presence of intraocular lens: Secondary | ICD-10-CM | POA: Diagnosis not present

## 2018-10-21 DIAGNOSIS — H5212 Myopia, left eye: Secondary | ICD-10-CM | POA: Diagnosis not present

## 2018-10-21 DIAGNOSIS — H43813 Vitreous degeneration, bilateral: Secondary | ICD-10-CM | POA: Diagnosis not present

## 2018-10-21 DIAGNOSIS — D3131 Benign neoplasm of right choroid: Secondary | ICD-10-CM | POA: Diagnosis not present

## 2018-10-21 DIAGNOSIS — H52203 Unspecified astigmatism, bilateral: Secondary | ICD-10-CM | POA: Diagnosis not present

## 2018-10-29 DIAGNOSIS — Z952 Presence of prosthetic heart valve: Secondary | ICD-10-CM | POA: Diagnosis not present

## 2018-10-29 DIAGNOSIS — Z950 Presence of cardiac pacemaker: Secondary | ICD-10-CM | POA: Diagnosis not present

## 2018-10-29 DIAGNOSIS — I480 Paroxysmal atrial fibrillation: Secondary | ICD-10-CM | POA: Diagnosis not present

## 2018-10-29 DIAGNOSIS — I10 Essential (primary) hypertension: Secondary | ICD-10-CM | POA: Diagnosis not present

## 2018-10-29 DIAGNOSIS — I25810 Atherosclerosis of coronary artery bypass graft(s) without angina pectoris: Secondary | ICD-10-CM | POA: Diagnosis not present

## 2018-10-29 DIAGNOSIS — E782 Mixed hyperlipidemia: Secondary | ICD-10-CM | POA: Diagnosis not present

## 2018-11-23 DIAGNOSIS — Z950 Presence of cardiac pacemaker: Secondary | ICD-10-CM | POA: Diagnosis not present

## 2018-12-29 DIAGNOSIS — G300 Alzheimer's disease with early onset: Secondary | ICD-10-CM | POA: Diagnosis not present

## 2018-12-29 DIAGNOSIS — Z23 Encounter for immunization: Secondary | ICD-10-CM | POA: Diagnosis not present

## 2018-12-29 DIAGNOSIS — Z0000 Encounter for general adult medical examination without abnormal findings: Secondary | ICD-10-CM | POA: Diagnosis not present

## 2018-12-29 DIAGNOSIS — I10 Essential (primary) hypertension: Secondary | ICD-10-CM | POA: Diagnosis not present

## 2018-12-29 DIAGNOSIS — Z950 Presence of cardiac pacemaker: Secondary | ICD-10-CM | POA: Diagnosis not present

## 2018-12-29 DIAGNOSIS — Z952 Presence of prosthetic heart valve: Secondary | ICD-10-CM | POA: Diagnosis not present

## 2018-12-29 DIAGNOSIS — G609 Hereditary and idiopathic neuropathy, unspecified: Secondary | ICD-10-CM | POA: Diagnosis not present

## 2018-12-29 DIAGNOSIS — F0280 Dementia in other diseases classified elsewhere without behavioral disturbance: Secondary | ICD-10-CM | POA: Diagnosis not present

## 2018-12-29 DIAGNOSIS — I480 Paroxysmal atrial fibrillation: Secondary | ICD-10-CM | POA: Diagnosis not present

## 2018-12-29 DIAGNOSIS — Z951 Presence of aortocoronary bypass graft: Secondary | ICD-10-CM | POA: Diagnosis not present

## 2018-12-29 DIAGNOSIS — I495 Sick sinus syndrome: Secondary | ICD-10-CM | POA: Diagnosis not present

## 2018-12-29 DIAGNOSIS — E782 Mixed hyperlipidemia: Secondary | ICD-10-CM | POA: Diagnosis not present

## 2019-03-29 DIAGNOSIS — H903 Sensorineural hearing loss, bilateral: Secondary | ICD-10-CM | POA: Diagnosis not present

## 2019-04-19 DIAGNOSIS — K625 Hemorrhage of anus and rectum: Secondary | ICD-10-CM | POA: Diagnosis not present

## 2019-04-19 DIAGNOSIS — S51812A Laceration without foreign body of left forearm, initial encounter: Secondary | ICD-10-CM | POA: Diagnosis not present

## 2019-04-19 DIAGNOSIS — R600 Localized edema: Secondary | ICD-10-CM | POA: Diagnosis not present

## 2019-05-21 DIAGNOSIS — C44612 Basal cell carcinoma of skin of right upper limb, including shoulder: Secondary | ICD-10-CM | POA: Diagnosis not present

## 2019-06-02 DIAGNOSIS — I495 Sick sinus syndrome: Secondary | ICD-10-CM | POA: Diagnosis not present

## 2019-06-02 DIAGNOSIS — Z950 Presence of cardiac pacemaker: Secondary | ICD-10-CM | POA: Diagnosis not present

## 2019-06-02 DIAGNOSIS — Z45018 Encounter for adjustment and management of other part of cardiac pacemaker: Secondary | ICD-10-CM | POA: Diagnosis not present

## 2019-06-07 DIAGNOSIS — C4490 Unspecified malignant neoplasm of skin, unspecified: Secondary | ICD-10-CM | POA: Diagnosis not present

## 2019-06-14 DIAGNOSIS — C44622 Squamous cell carcinoma of skin of right upper limb, including shoulder: Secondary | ICD-10-CM | POA: Diagnosis not present

## 2019-06-28 DIAGNOSIS — C44629 Squamous cell carcinoma of skin of left upper limb, including shoulder: Secondary | ICD-10-CM | POA: Diagnosis not present

## 2019-07-05 DIAGNOSIS — I25810 Atherosclerosis of coronary artery bypass graft(s) without angina pectoris: Secondary | ICD-10-CM | POA: Diagnosis not present

## 2019-07-05 DIAGNOSIS — Z952 Presence of prosthetic heart valve: Secondary | ICD-10-CM | POA: Diagnosis not present

## 2019-07-05 DIAGNOSIS — E782 Mixed hyperlipidemia: Secondary | ICD-10-CM | POA: Diagnosis not present

## 2019-07-05 DIAGNOSIS — I480 Paroxysmal atrial fibrillation: Secondary | ICD-10-CM | POA: Diagnosis not present

## 2019-07-05 DIAGNOSIS — Z45018 Encounter for adjustment and management of other part of cardiac pacemaker: Secondary | ICD-10-CM | POA: Diagnosis not present

## 2019-07-05 DIAGNOSIS — I10 Essential (primary) hypertension: Secondary | ICD-10-CM | POA: Diagnosis not present

## 2019-07-14 DIAGNOSIS — F0280 Dementia in other diseases classified elsewhere without behavioral disturbance: Secondary | ICD-10-CM | POA: Diagnosis not present

## 2019-07-14 DIAGNOSIS — I10 Essential (primary) hypertension: Secondary | ICD-10-CM | POA: Diagnosis not present

## 2019-07-14 DIAGNOSIS — Z23 Encounter for immunization: Secondary | ICD-10-CM | POA: Diagnosis not present

## 2019-07-14 DIAGNOSIS — G300 Alzheimer's disease with early onset: Secondary | ICD-10-CM | POA: Diagnosis not present

## 2019-07-14 DIAGNOSIS — H903 Sensorineural hearing loss, bilateral: Secondary | ICD-10-CM | POA: Diagnosis not present

## 2019-09-01 DIAGNOSIS — Z950 Presence of cardiac pacemaker: Secondary | ICD-10-CM | POA: Diagnosis not present

## 2019-10-25 DIAGNOSIS — H524 Presbyopia: Secondary | ICD-10-CM | POA: Diagnosis not present

## 2019-10-25 DIAGNOSIS — H353132 Nonexudative age-related macular degeneration, bilateral, intermediate dry stage: Secondary | ICD-10-CM | POA: Diagnosis not present

## 2019-10-25 DIAGNOSIS — D3131 Benign neoplasm of right choroid: Secondary | ICD-10-CM | POA: Diagnosis not present

## 2019-10-25 DIAGNOSIS — H43813 Vitreous degeneration, bilateral: Secondary | ICD-10-CM | POA: Diagnosis not present

## 2019-10-25 DIAGNOSIS — H35361 Drusen (degenerative) of macula, right eye: Secondary | ICD-10-CM | POA: Diagnosis not present

## 2019-10-25 DIAGNOSIS — Z961 Presence of intraocular lens: Secondary | ICD-10-CM | POA: Diagnosis not present

## 2019-10-25 DIAGNOSIS — H52203 Unspecified astigmatism, bilateral: Secondary | ICD-10-CM | POA: Diagnosis not present

## 2019-10-25 DIAGNOSIS — H35433 Paving stone degeneration of retina, bilateral: Secondary | ICD-10-CM | POA: Diagnosis not present

## 2019-10-25 DIAGNOSIS — H5213 Myopia, bilateral: Secondary | ICD-10-CM | POA: Diagnosis not present

## 2019-10-25 DIAGNOSIS — H31012 Macula scars of posterior pole (postinflammatory) (post-traumatic), left eye: Secondary | ICD-10-CM | POA: Diagnosis not present

## 2019-10-25 DIAGNOSIS — H3554 Dystrophies primarily involving the retinal pigment epithelium: Secondary | ICD-10-CM | POA: Diagnosis not present

## 2020-01-04 DIAGNOSIS — Z951 Presence of aortocoronary bypass graft: Secondary | ICD-10-CM | POA: Diagnosis not present

## 2020-01-04 DIAGNOSIS — F0280 Dementia in other diseases classified elsewhere without behavioral disturbance: Secondary | ICD-10-CM | POA: Diagnosis not present

## 2020-01-04 DIAGNOSIS — G300 Alzheimer's disease with early onset: Secondary | ICD-10-CM | POA: Diagnosis not present

## 2020-01-04 DIAGNOSIS — Z0000 Encounter for general adult medical examination without abnormal findings: Secondary | ICD-10-CM | POA: Diagnosis not present

## 2020-01-04 DIAGNOSIS — E782 Mixed hyperlipidemia: Secondary | ICD-10-CM | POA: Diagnosis not present

## 2020-01-04 DIAGNOSIS — H903 Sensorineural hearing loss, bilateral: Secondary | ICD-10-CM | POA: Diagnosis not present

## 2020-01-04 DIAGNOSIS — Z952 Presence of prosthetic heart valve: Secondary | ICD-10-CM | POA: Diagnosis not present

## 2020-01-04 DIAGNOSIS — Z950 Presence of cardiac pacemaker: Secondary | ICD-10-CM | POA: Diagnosis not present

## 2020-01-04 DIAGNOSIS — I495 Sick sinus syndrome: Secondary | ICD-10-CM | POA: Diagnosis not present

## 2020-01-04 DIAGNOSIS — D509 Iron deficiency anemia, unspecified: Secondary | ICD-10-CM | POA: Diagnosis not present

## 2020-01-04 DIAGNOSIS — G609 Hereditary and idiopathic neuropathy, unspecified: Secondary | ICD-10-CM | POA: Diagnosis not present

## 2020-01-04 DIAGNOSIS — I10 Essential (primary) hypertension: Secondary | ICD-10-CM | POA: Diagnosis not present

## 2020-01-04 DIAGNOSIS — I480 Paroxysmal atrial fibrillation: Secondary | ICD-10-CM | POA: Diagnosis not present

## 2020-01-24 DIAGNOSIS — D509 Iron deficiency anemia, unspecified: Secondary | ICD-10-CM | POA: Diagnosis not present

## 2020-03-01 DIAGNOSIS — Z950 Presence of cardiac pacemaker: Secondary | ICD-10-CM | POA: Diagnosis not present

## 2020-03-08 DIAGNOSIS — I480 Paroxysmal atrial fibrillation: Secondary | ICD-10-CM | POA: Diagnosis not present

## 2020-03-08 DIAGNOSIS — Z45018 Encounter for adjustment and management of other part of cardiac pacemaker: Secondary | ICD-10-CM | POA: Diagnosis not present

## 2020-03-08 DIAGNOSIS — E782 Mixed hyperlipidemia: Secondary | ICD-10-CM | POA: Diagnosis not present

## 2020-03-08 DIAGNOSIS — I25810 Atherosclerosis of coronary artery bypass graft(s) without angina pectoris: Secondary | ICD-10-CM | POA: Diagnosis not present

## 2020-03-08 DIAGNOSIS — I10 Essential (primary) hypertension: Secondary | ICD-10-CM | POA: Diagnosis not present

## 2020-03-08 DIAGNOSIS — Z952 Presence of prosthetic heart valve: Secondary | ICD-10-CM | POA: Diagnosis not present

## 2020-03-30 DIAGNOSIS — D509 Iron deficiency anemia, unspecified: Secondary | ICD-10-CM | POA: Diagnosis not present

## 2020-06-21 DIAGNOSIS — Z45018 Encounter for adjustment and management of other part of cardiac pacemaker: Secondary | ICD-10-CM | POA: Diagnosis not present

## 2020-06-21 DIAGNOSIS — I495 Sick sinus syndrome: Secondary | ICD-10-CM | POA: Diagnosis not present

## 2020-07-10 DIAGNOSIS — F0280 Dementia in other diseases classified elsewhere without behavioral disturbance: Secondary | ICD-10-CM | POA: Diagnosis not present

## 2020-07-10 DIAGNOSIS — G609 Hereditary and idiopathic neuropathy, unspecified: Secondary | ICD-10-CM | POA: Diagnosis not present

## 2020-07-10 DIAGNOSIS — I495 Sick sinus syndrome: Secondary | ICD-10-CM | POA: Diagnosis not present

## 2020-07-10 DIAGNOSIS — G300 Alzheimer's disease with early onset: Secondary | ICD-10-CM | POA: Diagnosis not present

## 2020-07-10 DIAGNOSIS — Z950 Presence of cardiac pacemaker: Secondary | ICD-10-CM | POA: Diagnosis not present

## 2020-07-10 DIAGNOSIS — I10 Essential (primary) hypertension: Secondary | ICD-10-CM | POA: Diagnosis not present

## 2020-07-10 DIAGNOSIS — Z951 Presence of aortocoronary bypass graft: Secondary | ICD-10-CM | POA: Diagnosis not present

## 2020-07-10 DIAGNOSIS — D509 Iron deficiency anemia, unspecified: Secondary | ICD-10-CM | POA: Diagnosis not present

## 2020-07-10 DIAGNOSIS — E782 Mixed hyperlipidemia: Secondary | ICD-10-CM | POA: Diagnosis not present

## 2020-07-10 DIAGNOSIS — H903 Sensorineural hearing loss, bilateral: Secondary | ICD-10-CM | POA: Diagnosis not present

## 2020-07-10 DIAGNOSIS — Z23 Encounter for immunization: Secondary | ICD-10-CM | POA: Diagnosis not present

## 2020-07-10 DIAGNOSIS — Z952 Presence of prosthetic heart valve: Secondary | ICD-10-CM | POA: Diagnosis not present

## 2020-07-18 DIAGNOSIS — Z7409 Other reduced mobility: Secondary | ICD-10-CM | POA: Diagnosis not present

## 2020-07-18 DIAGNOSIS — G8929 Other chronic pain: Secondary | ICD-10-CM | POA: Diagnosis not present

## 2020-07-18 DIAGNOSIS — M25562 Pain in left knee: Secondary | ICD-10-CM | POA: Diagnosis not present

## 2020-08-07 DIAGNOSIS — S32512A Fracture of superior rim of left pubis, initial encounter for closed fracture: Secondary | ICD-10-CM | POA: Diagnosis not present

## 2020-08-07 DIAGNOSIS — S3210XA Unspecified fracture of sacrum, initial encounter for closed fracture: Secondary | ICD-10-CM | POA: Diagnosis not present

## 2020-08-07 DIAGNOSIS — M545 Low back pain, unspecified: Secondary | ICD-10-CM | POA: Diagnosis not present

## 2020-08-07 DIAGNOSIS — R0902 Hypoxemia: Secondary | ICD-10-CM | POA: Diagnosis not present

## 2020-08-07 DIAGNOSIS — E782 Mixed hyperlipidemia: Secondary | ICD-10-CM | POA: Diagnosis not present

## 2020-08-07 DIAGNOSIS — W010XXA Fall on same level from slipping, tripping and stumbling without subsequent striking against object, initial encounter: Secondary | ICD-10-CM | POA: Diagnosis not present

## 2020-08-07 DIAGNOSIS — Z952 Presence of prosthetic heart valve: Secondary | ICD-10-CM | POA: Diagnosis not present

## 2020-08-07 DIAGNOSIS — I25810 Atherosclerosis of coronary artery bypass graft(s) without angina pectoris: Secondary | ICD-10-CM | POA: Diagnosis not present

## 2020-08-07 DIAGNOSIS — I110 Hypertensive heart disease with heart failure: Secondary | ICD-10-CM | POA: Diagnosis not present

## 2020-08-07 DIAGNOSIS — Z90710 Acquired absence of both cervix and uterus: Secondary | ICD-10-CM | POA: Diagnosis not present

## 2020-08-07 DIAGNOSIS — E871 Hypo-osmolality and hyponatremia: Secondary | ICD-10-CM | POA: Diagnosis not present

## 2020-08-07 DIAGNOSIS — S32599D Other specified fracture of unspecified pubis, subsequent encounter for fracture with routine healing: Secondary | ICD-10-CM | POA: Diagnosis not present

## 2020-08-07 DIAGNOSIS — I447 Left bundle-branch block, unspecified: Secondary | ICD-10-CM | POA: Diagnosis not present

## 2020-08-07 DIAGNOSIS — I259 Chronic ischemic heart disease, unspecified: Secondary | ICD-10-CM | POA: Diagnosis not present

## 2020-08-07 DIAGNOSIS — S22089A Unspecified fracture of T11-T12 vertebra, initial encounter for closed fracture: Secondary | ICD-10-CM | POA: Diagnosis not present

## 2020-08-07 DIAGNOSIS — Z7401 Bed confinement status: Secondary | ICD-10-CM | POA: Diagnosis not present

## 2020-08-07 DIAGNOSIS — I959 Hypotension, unspecified: Secondary | ICD-10-CM | POA: Diagnosis not present

## 2020-08-07 DIAGNOSIS — M2550 Pain in unspecified joint: Secondary | ICD-10-CM | POA: Diagnosis not present

## 2020-08-07 DIAGNOSIS — M4856XA Collapsed vertebra, not elsewhere classified, lumbar region, initial encounter for fracture: Secondary | ICD-10-CM | POA: Diagnosis not present

## 2020-08-07 DIAGNOSIS — J9601 Acute respiratory failure with hypoxia: Secondary | ICD-10-CM | POA: Diagnosis not present

## 2020-08-07 DIAGNOSIS — S32009A Unspecified fracture of unspecified lumbar vertebra, initial encounter for closed fracture: Secondary | ICD-10-CM | POA: Diagnosis not present

## 2020-08-07 DIAGNOSIS — Z86010 Personal history of colonic polyps: Secondary | ICD-10-CM | POA: Diagnosis not present

## 2020-08-07 DIAGNOSIS — I491 Atrial premature depolarization: Secondary | ICD-10-CM | POA: Diagnosis not present

## 2020-08-07 DIAGNOSIS — R410 Disorientation, unspecified: Secondary | ICD-10-CM | POA: Diagnosis not present

## 2020-08-07 DIAGNOSIS — M4854XA Collapsed vertebra, not elsewhere classified, thoracic region, initial encounter for fracture: Secondary | ICD-10-CM | POA: Diagnosis not present

## 2020-08-07 DIAGNOSIS — S32519D Fracture of superior rim of unspecified pubis, subsequent encounter for fracture with routine healing: Secondary | ICD-10-CM | POA: Diagnosis not present

## 2020-08-07 DIAGNOSIS — S3289XA Fracture of other parts of pelvis, initial encounter for closed fracture: Secondary | ICD-10-CM | POA: Diagnosis not present

## 2020-08-07 DIAGNOSIS — R296 Repeated falls: Secondary | ICD-10-CM | POA: Diagnosis not present

## 2020-08-07 DIAGNOSIS — S32511A Fracture of superior rim of right pubis, initial encounter for closed fracture: Secondary | ICD-10-CM | POA: Diagnosis not present

## 2020-08-07 DIAGNOSIS — Z8371 Family history of colonic polyps: Secondary | ICD-10-CM | POA: Diagnosis not present

## 2020-08-07 DIAGNOSIS — I248 Other forms of acute ischemic heart disease: Secondary | ICD-10-CM | POA: Diagnosis not present

## 2020-08-07 DIAGNOSIS — M159 Polyosteoarthritis, unspecified: Secondary | ICD-10-CM | POA: Diagnosis not present

## 2020-08-07 DIAGNOSIS — I509 Heart failure, unspecified: Secondary | ICD-10-CM | POA: Diagnosis not present

## 2020-08-07 DIAGNOSIS — S32591A Other specified fracture of right pubis, initial encounter for closed fracture: Secondary | ICD-10-CM | POA: Diagnosis not present

## 2020-08-07 DIAGNOSIS — S32592A Other specified fracture of left pubis, initial encounter for closed fracture: Secondary | ICD-10-CM | POA: Diagnosis not present

## 2020-08-07 DIAGNOSIS — S32000G Wedge compression fracture of unspecified lumbar vertebra, subsequent encounter for fracture with delayed healing: Secondary | ICD-10-CM | POA: Diagnosis not present

## 2020-08-07 DIAGNOSIS — Z950 Presence of cardiac pacemaker: Secondary | ICD-10-CM | POA: Diagnosis not present

## 2020-08-07 DIAGNOSIS — S3282XA Multiple fractures of pelvis without disruption of pelvic ring, initial encounter for closed fracture: Secondary | ICD-10-CM | POA: Diagnosis not present

## 2020-08-07 DIAGNOSIS — Z809 Family history of malignant neoplasm, unspecified: Secondary | ICD-10-CM | POA: Diagnosis not present

## 2020-08-07 DIAGNOSIS — Z041 Encounter for examination and observation following transport accident: Secondary | ICD-10-CM | POA: Diagnosis not present

## 2020-08-07 DIAGNOSIS — M25551 Pain in right hip: Secondary | ICD-10-CM | POA: Diagnosis not present

## 2020-08-07 DIAGNOSIS — F0390 Unspecified dementia without behavioral disturbance: Secondary | ICD-10-CM | POA: Diagnosis not present

## 2020-08-07 DIAGNOSIS — I440 Atrioventricular block, first degree: Secondary | ICD-10-CM | POA: Diagnosis not present

## 2020-08-07 DIAGNOSIS — S22080D Wedge compression fracture of T11-T12 vertebra, subsequent encounter for fracture with routine healing: Secondary | ICD-10-CM | POA: Diagnosis not present

## 2020-08-07 DIAGNOSIS — I2510 Atherosclerotic heart disease of native coronary artery without angina pectoris: Secondary | ICD-10-CM | POA: Diagnosis not present

## 2020-08-07 DIAGNOSIS — Z043 Encounter for examination and observation following other accident: Secondary | ICD-10-CM | POA: Diagnosis not present

## 2020-08-07 DIAGNOSIS — Z20822 Contact with and (suspected) exposure to covid-19: Secondary | ICD-10-CM | POA: Diagnosis not present

## 2020-08-07 DIAGNOSIS — W19XXXA Unspecified fall, initial encounter: Secondary | ICD-10-CM | POA: Diagnosis not present

## 2020-08-07 DIAGNOSIS — S3210XD Unspecified fracture of sacrum, subsequent encounter for fracture with routine healing: Secondary | ICD-10-CM | POA: Diagnosis not present

## 2020-08-10 DIAGNOSIS — S32009A Unspecified fracture of unspecified lumbar vertebra, initial encounter for closed fracture: Secondary | ICD-10-CM | POA: Diagnosis not present

## 2020-08-10 DIAGNOSIS — J9601 Acute respiratory failure with hypoxia: Secondary | ICD-10-CM | POA: Diagnosis not present

## 2020-08-10 DIAGNOSIS — W19XXXA Unspecified fall, initial encounter: Secondary | ICD-10-CM | POA: Diagnosis not present

## 2020-08-10 DIAGNOSIS — D6489 Other specified anemias: Secondary | ICD-10-CM | POA: Diagnosis not present

## 2020-08-10 DIAGNOSIS — S22080A Wedge compression fracture of T11-T12 vertebra, initial encounter for closed fracture: Secondary | ICD-10-CM | POA: Diagnosis not present

## 2020-08-10 DIAGNOSIS — F0280 Dementia in other diseases classified elsewhere without behavioral disturbance: Secondary | ICD-10-CM | POA: Diagnosis not present

## 2020-08-10 DIAGNOSIS — A000 Cholera due to Vibrio cholerae 01, biovar cholerae: Secondary | ICD-10-CM | POA: Diagnosis not present

## 2020-08-10 DIAGNOSIS — F320 Major depressive disorder, single episode, mild: Secondary | ICD-10-CM | POA: Diagnosis not present

## 2020-08-10 DIAGNOSIS — I480 Paroxysmal atrial fibrillation: Secondary | ICD-10-CM | POA: Diagnosis not present

## 2020-08-10 DIAGNOSIS — S22089A Unspecified fracture of T11-T12 vertebra, initial encounter for closed fracture: Secondary | ICD-10-CM | POA: Diagnosis not present

## 2020-08-10 DIAGNOSIS — R410 Disorientation, unspecified: Secondary | ICD-10-CM | POA: Diagnosis not present

## 2020-08-10 DIAGNOSIS — I25810 Atherosclerosis of coronary artery bypass graft(s) without angina pectoris: Secondary | ICD-10-CM | POA: Diagnosis not present

## 2020-08-10 DIAGNOSIS — S3282XA Multiple fractures of pelvis without disruption of pelvic ring, initial encounter for closed fracture: Secondary | ICD-10-CM | POA: Diagnosis not present

## 2020-08-10 DIAGNOSIS — N390 Urinary tract infection, site not specified: Secondary | ICD-10-CM | POA: Diagnosis not present

## 2020-08-10 DIAGNOSIS — F0150 Vascular dementia without behavioral disturbance: Secondary | ICD-10-CM | POA: Diagnosis not present

## 2020-08-10 DIAGNOSIS — E782 Mixed hyperlipidemia: Secondary | ICD-10-CM | POA: Diagnosis not present

## 2020-08-10 DIAGNOSIS — G478 Other sleep disorders: Secondary | ICD-10-CM | POA: Diagnosis not present

## 2020-08-10 DIAGNOSIS — M158 Other polyosteoarthritis: Secondary | ICD-10-CM | POA: Diagnosis not present

## 2020-08-10 DIAGNOSIS — I259 Chronic ischemic heart disease, unspecified: Secondary | ICD-10-CM | POA: Diagnosis not present

## 2020-08-10 DIAGNOSIS — Z7401 Bed confinement status: Secondary | ICD-10-CM | POA: Diagnosis not present

## 2020-08-10 DIAGNOSIS — M25551 Pain in right hip: Secondary | ICD-10-CM | POA: Diagnosis not present

## 2020-08-10 DIAGNOSIS — S22080D Wedge compression fracture of T11-T12 vertebra, subsequent encounter for fracture with routine healing: Secondary | ICD-10-CM | POA: Diagnosis not present

## 2020-08-10 DIAGNOSIS — S32599D Other specified fracture of unspecified pubis, subsequent encounter for fracture with routine healing: Secondary | ICD-10-CM | POA: Diagnosis not present

## 2020-08-10 DIAGNOSIS — S3210XD Unspecified fracture of sacrum, subsequent encounter for fracture with routine healing: Secondary | ICD-10-CM | POA: Diagnosis not present

## 2020-08-10 DIAGNOSIS — S32502A Unspecified fracture of left pubis, initial encounter for closed fracture: Secondary | ICD-10-CM | POA: Diagnosis not present

## 2020-08-10 DIAGNOSIS — S32000G Wedge compression fracture of unspecified lumbar vertebra, subsequent encounter for fracture with delayed healing: Secondary | ICD-10-CM | POA: Diagnosis not present

## 2020-08-10 DIAGNOSIS — S3289XA Fracture of other parts of pelvis, initial encounter for closed fracture: Secondary | ICD-10-CM | POA: Diagnosis not present

## 2020-08-10 DIAGNOSIS — I959 Hypotension, unspecified: Secondary | ICD-10-CM | POA: Diagnosis not present

## 2020-08-10 DIAGNOSIS — S32519D Fracture of superior rim of unspecified pubis, subsequent encounter for fracture with routine healing: Secondary | ICD-10-CM | POA: Diagnosis not present

## 2020-08-10 DIAGNOSIS — M2550 Pain in unspecified joint: Secondary | ICD-10-CM | POA: Diagnosis not present

## 2020-08-10 DIAGNOSIS — S32501A Unspecified fracture of right pubis, initial encounter for closed fracture: Secondary | ICD-10-CM | POA: Diagnosis not present

## 2020-08-10 DIAGNOSIS — I509 Heart failure, unspecified: Secondary | ICD-10-CM | POA: Diagnosis not present

## 2020-08-10 DIAGNOSIS — R296 Repeated falls: Secondary | ICD-10-CM | POA: Diagnosis not present

## 2020-08-10 DIAGNOSIS — S32591A Other specified fracture of right pubis, initial encounter for closed fracture: Secondary | ICD-10-CM | POA: Diagnosis not present

## 2020-08-10 DIAGNOSIS — M159 Polyosteoarthritis, unspecified: Secondary | ICD-10-CM | POA: Diagnosis not present

## 2020-08-10 DIAGNOSIS — G301 Alzheimer's disease with late onset: Secondary | ICD-10-CM | POA: Diagnosis not present

## 2020-08-10 DIAGNOSIS — I5189 Other ill-defined heart diseases: Secondary | ICD-10-CM | POA: Diagnosis not present

## 2020-08-10 DIAGNOSIS — E871 Hypo-osmolality and hyponatremia: Secondary | ICD-10-CM | POA: Diagnosis not present

## 2020-08-10 DIAGNOSIS — S32599A Other specified fracture of unspecified pubis, initial encounter for closed fracture: Secondary | ICD-10-CM | POA: Diagnosis not present

## 2020-08-10 DIAGNOSIS — R5381 Other malaise: Secondary | ICD-10-CM | POA: Diagnosis not present

## 2020-08-10 DIAGNOSIS — S32592A Other specified fracture of left pubis, initial encounter for closed fracture: Secondary | ICD-10-CM | POA: Diagnosis not present

## 2020-08-10 DIAGNOSIS — S32599 Other specified fracture of unspecified pubis: Secondary | ICD-10-CM | POA: Diagnosis not present

## 2020-08-10 DIAGNOSIS — I10 Essential (primary) hypertension: Secondary | ICD-10-CM | POA: Diagnosis not present

## 2020-08-11 DIAGNOSIS — G301 Alzheimer's disease with late onset: Secondary | ICD-10-CM | POA: Diagnosis not present

## 2020-08-11 DIAGNOSIS — I25810 Atherosclerosis of coronary artery bypass graft(s) without angina pectoris: Secondary | ICD-10-CM | POA: Diagnosis not present

## 2020-08-11 DIAGNOSIS — F0280 Dementia in other diseases classified elsewhere without behavioral disturbance: Secondary | ICD-10-CM | POA: Diagnosis not present

## 2020-08-11 DIAGNOSIS — S32599A Other specified fracture of unspecified pubis, initial encounter for closed fracture: Secondary | ICD-10-CM | POA: Diagnosis not present

## 2020-08-11 DIAGNOSIS — I10 Essential (primary) hypertension: Secondary | ICD-10-CM | POA: Diagnosis not present

## 2020-08-11 DIAGNOSIS — S22080A Wedge compression fracture of T11-T12 vertebra, initial encounter for closed fracture: Secondary | ICD-10-CM | POA: Diagnosis not present

## 2020-08-14 DIAGNOSIS — F0150 Vascular dementia without behavioral disturbance: Secondary | ICD-10-CM | POA: Diagnosis not present

## 2020-08-14 DIAGNOSIS — R5381 Other malaise: Secondary | ICD-10-CM | POA: Diagnosis not present

## 2020-08-14 DIAGNOSIS — S22080A Wedge compression fracture of T11-T12 vertebra, initial encounter for closed fracture: Secondary | ICD-10-CM | POA: Diagnosis not present

## 2020-08-14 DIAGNOSIS — D6489 Other specified anemias: Secondary | ICD-10-CM | POA: Diagnosis not present

## 2020-08-14 DIAGNOSIS — S32599A Other specified fracture of unspecified pubis, initial encounter for closed fracture: Secondary | ICD-10-CM | POA: Diagnosis not present

## 2020-08-14 DIAGNOSIS — I10 Essential (primary) hypertension: Secondary | ICD-10-CM | POA: Diagnosis not present

## 2020-08-14 DIAGNOSIS — G478 Other sleep disorders: Secondary | ICD-10-CM | POA: Diagnosis not present

## 2020-08-14 DIAGNOSIS — I5189 Other ill-defined heart diseases: Secondary | ICD-10-CM | POA: Diagnosis not present

## 2020-08-14 DIAGNOSIS — E871 Hypo-osmolality and hyponatremia: Secondary | ICD-10-CM | POA: Diagnosis not present

## 2020-08-14 DIAGNOSIS — I480 Paroxysmal atrial fibrillation: Secondary | ICD-10-CM | POA: Diagnosis not present

## 2020-08-14 DIAGNOSIS — F320 Major depressive disorder, single episode, mild: Secondary | ICD-10-CM | POA: Diagnosis not present

## 2020-08-14 DIAGNOSIS — M158 Other polyosteoarthritis: Secondary | ICD-10-CM | POA: Diagnosis not present

## 2020-08-14 DIAGNOSIS — S32599 Other specified fracture of unspecified pubis: Secondary | ICD-10-CM | POA: Diagnosis not present

## 2020-08-15 DIAGNOSIS — E782 Mixed hyperlipidemia: Secondary | ICD-10-CM | POA: Diagnosis not present

## 2020-08-15 DIAGNOSIS — A000 Cholera due to Vibrio cholerae 01, biovar cholerae: Secondary | ICD-10-CM | POA: Diagnosis not present

## 2020-08-15 DIAGNOSIS — R410 Disorientation, unspecified: Secondary | ICD-10-CM | POA: Diagnosis not present

## 2020-08-15 DIAGNOSIS — I10 Essential (primary) hypertension: Secondary | ICD-10-CM | POA: Diagnosis not present

## 2020-08-16 DIAGNOSIS — D6489 Other specified anemias: Secondary | ICD-10-CM | POA: Diagnosis not present

## 2020-08-16 DIAGNOSIS — F320 Major depressive disorder, single episode, mild: Secondary | ICD-10-CM | POA: Diagnosis not present

## 2020-08-16 DIAGNOSIS — I10 Essential (primary) hypertension: Secondary | ICD-10-CM | POA: Diagnosis not present

## 2020-08-16 DIAGNOSIS — F0150 Vascular dementia without behavioral disturbance: Secondary | ICD-10-CM | POA: Diagnosis not present

## 2020-08-16 DIAGNOSIS — E871 Hypo-osmolality and hyponatremia: Secondary | ICD-10-CM | POA: Diagnosis not present

## 2020-08-18 DIAGNOSIS — I5189 Other ill-defined heart diseases: Secondary | ICD-10-CM | POA: Diagnosis not present

## 2020-08-18 DIAGNOSIS — S32599A Other specified fracture of unspecified pubis, initial encounter for closed fracture: Secondary | ICD-10-CM | POA: Diagnosis not present

## 2020-08-22 DIAGNOSIS — N390 Urinary tract infection, site not specified: Secondary | ICD-10-CM | POA: Diagnosis not present

## 2020-08-22 DIAGNOSIS — E871 Hypo-osmolality and hyponatremia: Secondary | ICD-10-CM | POA: Diagnosis not present

## 2020-08-24 DIAGNOSIS — S32502A Unspecified fracture of left pubis, initial encounter for closed fracture: Secondary | ICD-10-CM | POA: Diagnosis not present

## 2020-08-24 DIAGNOSIS — S32501A Unspecified fracture of right pubis, initial encounter for closed fracture: Secondary | ICD-10-CM | POA: Diagnosis not present

## 2020-08-24 DIAGNOSIS — M25551 Pain in right hip: Secondary | ICD-10-CM | POA: Diagnosis not present

## 2020-08-28 DIAGNOSIS — I10 Essential (primary) hypertension: Secondary | ICD-10-CM | POA: Diagnosis not present

## 2020-08-28 DIAGNOSIS — S32599A Other specified fracture of unspecified pubis, initial encounter for closed fracture: Secondary | ICD-10-CM | POA: Diagnosis not present

## 2020-08-29 DIAGNOSIS — E871 Hypo-osmolality and hyponatremia: Secondary | ICD-10-CM | POA: Diagnosis not present

## 2020-09-16 DIAGNOSIS — S32058D Other fracture of fifth lumbar vertebra, subsequent encounter for fracture with routine healing: Secondary | ICD-10-CM | POA: Diagnosis not present

## 2020-09-16 DIAGNOSIS — S32512A Fracture of superior rim of left pubis, initial encounter for closed fracture: Secondary | ICD-10-CM | POA: Diagnosis not present

## 2020-09-16 DIAGNOSIS — M159 Polyosteoarthritis, unspecified: Secondary | ICD-10-CM | POA: Diagnosis not present

## 2020-09-16 DIAGNOSIS — I480 Paroxysmal atrial fibrillation: Secondary | ICD-10-CM | POA: Diagnosis not present

## 2020-09-16 DIAGNOSIS — I447 Left bundle-branch block, unspecified: Secondary | ICD-10-CM | POA: Diagnosis not present

## 2020-09-16 DIAGNOSIS — G609 Hereditary and idiopathic neuropathy, unspecified: Secondary | ICD-10-CM | POA: Diagnosis not present

## 2020-09-16 DIAGNOSIS — S32038D Other fracture of third lumbar vertebra, subsequent encounter for fracture with routine healing: Secondary | ICD-10-CM | POA: Diagnosis not present

## 2020-09-16 DIAGNOSIS — F32A Depression, unspecified: Secondary | ICD-10-CM | POA: Diagnosis not present

## 2020-09-16 DIAGNOSIS — S32048D Other fracture of fourth lumbar vertebra, subsequent encounter for fracture with routine healing: Secondary | ICD-10-CM | POA: Diagnosis not present

## 2020-09-16 DIAGNOSIS — S32591D Other specified fracture of right pubis, subsequent encounter for fracture with routine healing: Secondary | ICD-10-CM | POA: Diagnosis not present

## 2020-09-16 DIAGNOSIS — F0280 Dementia in other diseases classified elsewhere without behavioral disturbance: Secondary | ICD-10-CM | POA: Diagnosis not present

## 2020-09-16 DIAGNOSIS — I495 Sick sinus syndrome: Secondary | ICD-10-CM | POA: Diagnosis not present

## 2020-09-16 DIAGNOSIS — F0150 Vascular dementia without behavioral disturbance: Secondary | ICD-10-CM | POA: Diagnosis not present

## 2020-09-16 DIAGNOSIS — I509 Heart failure, unspecified: Secondary | ICD-10-CM | POA: Diagnosis not present

## 2020-09-16 DIAGNOSIS — S3210XD Unspecified fracture of sacrum, subsequent encounter for fracture with routine healing: Secondary | ICD-10-CM | POA: Diagnosis not present

## 2020-09-16 DIAGNOSIS — I2510 Atherosclerotic heart disease of native coronary artery without angina pectoris: Secondary | ICD-10-CM | POA: Diagnosis not present

## 2020-09-16 DIAGNOSIS — I110 Hypertensive heart disease with heart failure: Secondary | ICD-10-CM | POA: Diagnosis not present

## 2020-09-16 DIAGNOSIS — S32028D Other fracture of second lumbar vertebra, subsequent encounter for fracture with routine healing: Secondary | ICD-10-CM | POA: Diagnosis not present

## 2020-09-16 DIAGNOSIS — S22088D Other fracture of T11-T12 vertebra, subsequent encounter for fracture with routine healing: Secondary | ICD-10-CM | POA: Diagnosis not present

## 2020-09-16 DIAGNOSIS — S32592D Other specified fracture of left pubis, subsequent encounter for fracture with routine healing: Secondary | ICD-10-CM | POA: Diagnosis not present

## 2020-09-16 DIAGNOSIS — I25810 Atherosclerosis of coronary artery bypass graft(s) without angina pectoris: Secondary | ICD-10-CM | POA: Diagnosis not present

## 2020-09-16 DIAGNOSIS — G301 Alzheimer's disease with late onset: Secondary | ICD-10-CM | POA: Diagnosis not present

## 2020-09-16 DIAGNOSIS — S32018D Other fracture of first lumbar vertebra, subsequent encounter for fracture with routine healing: Secondary | ICD-10-CM | POA: Diagnosis not present

## 2020-09-16 DIAGNOSIS — M810 Age-related osteoporosis without current pathological fracture: Secondary | ICD-10-CM | POA: Diagnosis not present

## 2020-09-16 DIAGNOSIS — S32511D Fracture of superior rim of right pubis, subsequent encounter for fracture with routine healing: Secondary | ICD-10-CM | POA: Diagnosis not present

## 2020-09-25 DIAGNOSIS — S3210XD Unspecified fracture of sacrum, subsequent encounter for fracture with routine healing: Secondary | ICD-10-CM | POA: Diagnosis not present

## 2020-09-25 DIAGNOSIS — S32048D Other fracture of fourth lumbar vertebra, subsequent encounter for fracture with routine healing: Secondary | ICD-10-CM | POA: Diagnosis not present

## 2020-09-25 DIAGNOSIS — S32592D Other specified fracture of left pubis, subsequent encounter for fracture with routine healing: Secondary | ICD-10-CM | POA: Diagnosis not present

## 2020-09-25 DIAGNOSIS — S32591D Other specified fracture of right pubis, subsequent encounter for fracture with routine healing: Secondary | ICD-10-CM | POA: Diagnosis not present

## 2020-09-25 DIAGNOSIS — S32058D Other fracture of fifth lumbar vertebra, subsequent encounter for fracture with routine healing: Secondary | ICD-10-CM | POA: Diagnosis not present

## 2020-09-25 DIAGNOSIS — G301 Alzheimer's disease with late onset: Secondary | ICD-10-CM | POA: Diagnosis not present

## 2020-09-25 DIAGNOSIS — S32038D Other fracture of third lumbar vertebra, subsequent encounter for fracture with routine healing: Secondary | ICD-10-CM | POA: Diagnosis not present

## 2020-09-25 DIAGNOSIS — S32018D Other fracture of first lumbar vertebra, subsequent encounter for fracture with routine healing: Secondary | ICD-10-CM | POA: Diagnosis not present

## 2020-09-25 DIAGNOSIS — S22088D Other fracture of T11-T12 vertebra, subsequent encounter for fracture with routine healing: Secondary | ICD-10-CM | POA: Diagnosis not present

## 2020-09-25 DIAGNOSIS — S32028D Other fracture of second lumbar vertebra, subsequent encounter for fracture with routine healing: Secondary | ICD-10-CM | POA: Diagnosis not present

## 2020-09-25 DIAGNOSIS — S32511D Fracture of superior rim of right pubis, subsequent encounter for fracture with routine healing: Secondary | ICD-10-CM | POA: Diagnosis not present

## 2020-09-25 DIAGNOSIS — S32512A Fracture of superior rim of left pubis, initial encounter for closed fracture: Secondary | ICD-10-CM | POA: Diagnosis not present

## 2020-10-10 DIAGNOSIS — G609 Hereditary and idiopathic neuropathy, unspecified: Secondary | ICD-10-CM | POA: Diagnosis not present

## 2020-10-10 DIAGNOSIS — H903 Sensorineural hearing loss, bilateral: Secondary | ICD-10-CM | POA: Diagnosis not present

## 2020-10-10 DIAGNOSIS — S22080D Wedge compression fracture of T11-T12 vertebra, subsequent encounter for fracture with routine healing: Secondary | ICD-10-CM | POA: Diagnosis not present

## 2020-10-10 DIAGNOSIS — I10 Essential (primary) hypertension: Secondary | ICD-10-CM | POA: Diagnosis not present

## 2020-10-10 DIAGNOSIS — D509 Iron deficiency anemia, unspecified: Secondary | ICD-10-CM | POA: Diagnosis not present

## 2020-10-10 DIAGNOSIS — E782 Mixed hyperlipidemia: Secondary | ICD-10-CM | POA: Diagnosis not present

## 2020-10-10 DIAGNOSIS — I495 Sick sinus syndrome: Secondary | ICD-10-CM | POA: Diagnosis not present

## 2020-10-10 DIAGNOSIS — Z950 Presence of cardiac pacemaker: Secondary | ICD-10-CM | POA: Diagnosis not present

## 2020-10-10 DIAGNOSIS — S32599S Other specified fracture of unspecified pubis, sequela: Secondary | ICD-10-CM | POA: Diagnosis not present

## 2020-10-10 DIAGNOSIS — Z951 Presence of aortocoronary bypass graft: Secondary | ICD-10-CM | POA: Diagnosis not present

## 2020-10-10 DIAGNOSIS — Z952 Presence of prosthetic heart valve: Secondary | ICD-10-CM | POA: Diagnosis not present

## 2020-10-10 DIAGNOSIS — G300 Alzheimer's disease with early onset: Secondary | ICD-10-CM | POA: Diagnosis not present

## 2020-10-20 DIAGNOSIS — S32028D Other fracture of second lumbar vertebra, subsequent encounter for fracture with routine healing: Secondary | ICD-10-CM | POA: Diagnosis not present

## 2020-10-20 DIAGNOSIS — I25810 Atherosclerosis of coronary artery bypass graft(s) without angina pectoris: Secondary | ICD-10-CM | POA: Diagnosis not present

## 2020-10-20 DIAGNOSIS — S32511D Fracture of superior rim of right pubis, subsequent encounter for fracture with routine healing: Secondary | ICD-10-CM | POA: Diagnosis not present

## 2020-10-20 DIAGNOSIS — I110 Hypertensive heart disease with heart failure: Secondary | ICD-10-CM | POA: Diagnosis not present

## 2020-10-20 DIAGNOSIS — G301 Alzheimer's disease with late onset: Secondary | ICD-10-CM | POA: Diagnosis not present

## 2020-10-20 DIAGNOSIS — I2510 Atherosclerotic heart disease of native coronary artery without angina pectoris: Secondary | ICD-10-CM | POA: Diagnosis not present

## 2020-10-20 DIAGNOSIS — S32591D Other specified fracture of right pubis, subsequent encounter for fracture with routine healing: Secondary | ICD-10-CM | POA: Diagnosis not present

## 2020-10-20 DIAGNOSIS — F0280 Dementia in other diseases classified elsewhere without behavioral disturbance: Secondary | ICD-10-CM | POA: Diagnosis not present

## 2020-10-20 DIAGNOSIS — S32058D Other fracture of fifth lumbar vertebra, subsequent encounter for fracture with routine healing: Secondary | ICD-10-CM | POA: Diagnosis not present

## 2020-10-20 DIAGNOSIS — S32038D Other fracture of third lumbar vertebra, subsequent encounter for fracture with routine healing: Secondary | ICD-10-CM | POA: Diagnosis not present

## 2020-10-20 DIAGNOSIS — S32048D Other fracture of fourth lumbar vertebra, subsequent encounter for fracture with routine healing: Secondary | ICD-10-CM | POA: Diagnosis not present

## 2020-10-20 DIAGNOSIS — I480 Paroxysmal atrial fibrillation: Secondary | ICD-10-CM | POA: Diagnosis not present

## 2020-10-20 DIAGNOSIS — M810 Age-related osteoporosis without current pathological fracture: Secondary | ICD-10-CM | POA: Diagnosis not present

## 2020-10-20 DIAGNOSIS — F0150 Vascular dementia without behavioral disturbance: Secondary | ICD-10-CM | POA: Diagnosis not present

## 2020-10-20 DIAGNOSIS — S3210XD Unspecified fracture of sacrum, subsequent encounter for fracture with routine healing: Secondary | ICD-10-CM | POA: Diagnosis not present

## 2020-10-20 DIAGNOSIS — I509 Heart failure, unspecified: Secondary | ICD-10-CM | POA: Diagnosis not present

## 2020-10-20 DIAGNOSIS — I447 Left bundle-branch block, unspecified: Secondary | ICD-10-CM | POA: Diagnosis not present

## 2020-10-20 DIAGNOSIS — I495 Sick sinus syndrome: Secondary | ICD-10-CM | POA: Diagnosis not present

## 2020-10-20 DIAGNOSIS — S32512A Fracture of superior rim of left pubis, initial encounter for closed fracture: Secondary | ICD-10-CM | POA: Diagnosis not present

## 2020-10-20 DIAGNOSIS — M159 Polyosteoarthritis, unspecified: Secondary | ICD-10-CM | POA: Diagnosis not present

## 2020-10-20 DIAGNOSIS — S22088D Other fracture of T11-T12 vertebra, subsequent encounter for fracture with routine healing: Secondary | ICD-10-CM | POA: Diagnosis not present

## 2020-10-20 DIAGNOSIS — G609 Hereditary and idiopathic neuropathy, unspecified: Secondary | ICD-10-CM | POA: Diagnosis not present

## 2020-10-20 DIAGNOSIS — S32018D Other fracture of first lumbar vertebra, subsequent encounter for fracture with routine healing: Secondary | ICD-10-CM | POA: Diagnosis not present

## 2020-10-20 DIAGNOSIS — S32592D Other specified fracture of left pubis, subsequent encounter for fracture with routine healing: Secondary | ICD-10-CM | POA: Diagnosis not present

## 2020-10-20 DIAGNOSIS — F32A Depression, unspecified: Secondary | ICD-10-CM | POA: Diagnosis not present

## 2020-10-23 ENCOUNTER — Encounter (HOSPITAL_BASED_OUTPATIENT_CLINIC_OR_DEPARTMENT_OTHER): Payer: Self-pay

## 2020-10-23 ENCOUNTER — Emergency Department (HOSPITAL_BASED_OUTPATIENT_CLINIC_OR_DEPARTMENT_OTHER): Payer: PPO

## 2020-10-23 ENCOUNTER — Other Ambulatory Visit: Payer: Self-pay

## 2020-10-23 ENCOUNTER — Emergency Department (HOSPITAL_BASED_OUTPATIENT_CLINIC_OR_DEPARTMENT_OTHER)
Admission: EM | Admit: 2020-10-23 | Discharge: 2020-10-23 | Disposition: A | Discharge status: Home / Self Care | Payer: PPO | Attending: Emergency Medicine | Admitting: Emergency Medicine

## 2020-10-23 DIAGNOSIS — S0990XA Unspecified injury of head, initial encounter: Secondary | ICD-10-CM | POA: Diagnosis present

## 2020-10-23 DIAGNOSIS — W1839XA Other fall on same level, initial encounter: Secondary | ICD-10-CM | POA: Diagnosis not present

## 2020-10-23 DIAGNOSIS — I10 Essential (primary) hypertension: Secondary | ICD-10-CM | POA: Insufficient documentation

## 2020-10-23 DIAGNOSIS — G9389 Other specified disorders of brain: Secondary | ICD-10-CM | POA: Diagnosis not present

## 2020-10-23 DIAGNOSIS — S0101XA Laceration without foreign body of scalp, initial encounter: Secondary | ICD-10-CM | POA: Insufficient documentation

## 2020-10-23 DIAGNOSIS — S0003XA Contusion of scalp, initial encounter: Secondary | ICD-10-CM | POA: Diagnosis not present

## 2020-10-23 DIAGNOSIS — G319 Degenerative disease of nervous system, unspecified: Secondary | ICD-10-CM | POA: Diagnosis not present

## 2020-10-23 DIAGNOSIS — Y93B9 Activity, other involving muscle strengthening exercises: Secondary | ICD-10-CM | POA: Insufficient documentation

## 2020-10-23 DIAGNOSIS — I6389 Other cerebral infarction: Secondary | ICD-10-CM | POA: Diagnosis not present

## 2020-10-23 HISTORY — DX: Essential (primary) hypertension: I10

## 2020-10-23 NOTE — Discharge Instructions (Addendum)
Keep the wound dry for 48 hours.  You may wash gently with soap and water afterwards.  Return immediately if you see signs of infection such as purulent drainage increased redness pain or any additional concerns.  Return in 10 days for staple removal.

## 2020-10-23 NOTE — ED Triage Notes (Signed)
Pt was attempting to do exercises, stood up and fell backwards hitting head on door frame. Recently admitted for pelvic fractures. Arrives with laceration to posterior head. Not on thinners, denies LOC. Went to Urgent care who close laceration with staples. Want pt to get CT.

## 2020-10-23 NOTE — ED Provider Notes (Signed)
[facility] Provider Note   CSN: [id number] Arrival date & time: 10/23/20  1817     History Chief Complaint  Patient presents with  . Fall    Kristie Norris is a 87 y.o. female.   Patient presents ER chief complaint of head injury.  She was doing her physical therapy exercises morning when she lost her balance and fell and hit her head.  She denies loss of consciousness.  Denies pain elsewhere.  Denies neck pain back pain wrist pain or lower extremity pain.  She was seen in urgent care today and the wound was cleaned and staples were placed per patient and her daughter.  She was sent to the ER for CT imaging.  The daughter states her tetanus is up-to-date.        Past Medical History:  Diagnosis Date  . Hypertension     There are no problems to display for this patient.   History reviewed. No pertinent surgical history.   OB History   No obstetric history on file.     History reviewed. No pertinent family history.     Home Medications Prior to Admission medications   Not on File    Allergies    Penicillins, Sulfa antibiotics, Codeine, and Amlodipine  Review of Systems   Review of Systems  Constitutional: Negative for fever.  HENT: Negative for ear pain.   Eyes: Negative for pain.  Respiratory: Negative for cough.   Cardiovascular: Negative for chest pain.  Gastrointestinal: Negative for abdominal pain.  Genitourinary: Negative for flank pain.  Musculoskeletal: Negative for back pain.  Skin: Negative for rash.  Neurological: Negative for headaches.    Physical Exam Updated Vital Signs BP (!) 178/70 (BP Location: Right Arm)   Pulse 70   Temp 98.2 F (36.8 C) (Oral)   Resp 19   Ht 4\' 9"  (1.448 m)   Wt 45.4 kg   SpO2 96%   BMI 21.64 kg/m   Physical Exam Constitutional:      General: She is not in acute distress.    Appearance: Normal appearance.  HENT:     Head: Normocephalic.     Nose: Nose normal.  Eyes:      Extraocular Movements: Extraocular movements intact.  Cardiovascular:     Rate and Rhythm: Normal rate.  Pulmonary:     Effort: Pulmonary effort is normal.  Musculoskeletal:        General: Normal range of motion.     Cervical back: Normal range of motion.  Neurological:     General: No focal deficit present.     Mental Status: She is alert. Mental status is at baseline.     Comments: Patient moving shoulders elbows wrists hips knees and ankles without pain or discomfort.  No C/C/L-spine step-offs or tenderness noted on exam.     ED Results / Procedures / Treatments   Labs (all labs ordered are listed, but only abnormal results are displayed) Labs Reviewed - No data to display  EKG None  Radiology CT Head Wo Contrast  Result Date: 10/23/2020 CLINICAL DATA:  Head trauma. Fell while exercising, hitting head on door frame. EXAM: CT HEAD WITHOUT CONTRAST TECHNIQUE: Contiguous axial images were obtained from the base of the skull through the vertex without intravenous contrast. COMPARISON:  08/07/2020 head CT.  08/22/2007 head MRI. FINDINGS: Brain: A 2.4 x 2.3 cm mass involving the right cavernous sinuses unchanged. No acute infarct, intracranial hemorrhage, midline shift, or extra-axial fluid  collection is identified. Chronic infarcts are again noted in the right basal ganglia and both thalami. There is associated ex vacuo dilatation of the frontal horn of the right lateral ventricle. Hypodensities elsewhere in the cerebral white matter bilaterally are unchanged and nonspecific but compatible with moderate chronic small vessel ischemic disease. 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 There is mild cerebral atrophy. A small chronic right cerebellar infarct is again noted. Vascular: Calcified atherosclerosis at the skull base. Bilateral ICA dolichoectasia. Skull: No fracture or suspicious osseous  lesion. Sinuses/Orbits: The visualized paranasal sinuses and mastoid air cells are clear. Bilateral cataract extraction. Other: Small to moderate-sized left parietal scalp hematoma with skin staples in place. IMPRESSION: 1. No evidence of acute intracranial abnormality. 2. Left parietal scalp hematoma. 3. 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 4. Moderate chronic small vessel ischemic disease. Electronically Signed   By: Logan Bores M.D.   On: 10/23/2020 20:18    Procedures Procedures (including critical care time)  Medications Ordered in ED Medications - No data to display  ED Course  I have reviewed the triage vital signs and the nursing notes.  Pertinent labs & imaging results that were available during my care of the patient were reviewed by me and considered in my medical decision making (see chart for details).    MDM Rules/Calculators/A&P                          CT imaging shows no acute findings.  There is a persistent unchanged right cavernous sinus mass present.  Family is aware of this mass and they are following with her primary care doctor.  Advised return in 10 days for suture removal.  Advised me to return if she has pain trouble breathing worsening symptoms or any additional concerns.  Final Clinical Impression(s) / ED Diagnoses Final diagnoses:  Laceration of scalp, initial encounter    Rx / DC Orders ED Discharge Orders    None       Luna Fuse, MD 10/23/20 2050

## 2020-11-02 DIAGNOSIS — M810 Age-related osteoporosis without current pathological fracture: Secondary | ICD-10-CM | POA: Diagnosis not present

## 2020-11-02 DIAGNOSIS — M79661 Pain in right lower leg: Secondary | ICD-10-CM | POA: Diagnosis not present

## 2020-11-02 DIAGNOSIS — L03115 Cellulitis of right lower limb: Secondary | ICD-10-CM | POA: Diagnosis not present

## 2020-11-02 DIAGNOSIS — R3981 Functional urinary incontinence: Secondary | ICD-10-CM | POA: Diagnosis not present

## 2020-11-02 DIAGNOSIS — R32 Unspecified urinary incontinence: Secondary | ICD-10-CM | POA: Diagnosis not present

## 2020-11-02 DIAGNOSIS — R600 Localized edema: Secondary | ICD-10-CM | POA: Diagnosis not present

## 2020-11-02 DIAGNOSIS — Z4802 Encounter for removal of sutures: Secondary | ICD-10-CM | POA: Diagnosis not present

## 2020-11-02 DIAGNOSIS — M7989 Other specified soft tissue disorders: Secondary | ICD-10-CM | POA: Diagnosis not present

## 2020-11-09 DIAGNOSIS — I10 Essential (primary) hypertension: Secondary | ICD-10-CM | POA: Diagnosis not present

## 2020-11-09 DIAGNOSIS — Z952 Presence of prosthetic heart valve: Secondary | ICD-10-CM | POA: Diagnosis not present

## 2020-11-09 DIAGNOSIS — I480 Paroxysmal atrial fibrillation: Secondary | ICD-10-CM | POA: Diagnosis not present

## 2020-11-09 DIAGNOSIS — E782 Mixed hyperlipidemia: Secondary | ICD-10-CM | POA: Diagnosis not present

## 2020-11-09 DIAGNOSIS — I25810 Atherosclerosis of coronary artery bypass graft(s) without angina pectoris: Secondary | ICD-10-CM | POA: Diagnosis not present

## 2020-11-09 DIAGNOSIS — Z45018 Encounter for adjustment and management of other part of cardiac pacemaker: Secondary | ICD-10-CM | POA: Diagnosis not present

## 2020-11-10 DIAGNOSIS — F0150 Vascular dementia without behavioral disturbance: Secondary | ICD-10-CM | POA: Diagnosis not present

## 2020-11-10 DIAGNOSIS — M810 Age-related osteoporosis without current pathological fracture: Secondary | ICD-10-CM | POA: Diagnosis not present

## 2020-11-10 DIAGNOSIS — S32048D Other fracture of fourth lumbar vertebra, subsequent encounter for fracture with routine healing: Secondary | ICD-10-CM | POA: Diagnosis not present

## 2020-11-10 DIAGNOSIS — S32018D Other fracture of first lumbar vertebra, subsequent encounter for fracture with routine healing: Secondary | ICD-10-CM | POA: Diagnosis not present

## 2020-11-10 DIAGNOSIS — I447 Left bundle-branch block, unspecified: Secondary | ICD-10-CM | POA: Diagnosis not present

## 2020-11-10 DIAGNOSIS — I495 Sick sinus syndrome: Secondary | ICD-10-CM | POA: Diagnosis not present

## 2020-11-10 DIAGNOSIS — S32058D Other fracture of fifth lumbar vertebra, subsequent encounter for fracture with routine healing: Secondary | ICD-10-CM | POA: Diagnosis not present

## 2020-11-10 DIAGNOSIS — S32591D Other specified fracture of right pubis, subsequent encounter for fracture with routine healing: Secondary | ICD-10-CM | POA: Diagnosis not present

## 2020-11-10 DIAGNOSIS — M159 Polyosteoarthritis, unspecified: Secondary | ICD-10-CM | POA: Diagnosis not present

## 2020-11-10 DIAGNOSIS — I110 Hypertensive heart disease with heart failure: Secondary | ICD-10-CM | POA: Diagnosis not present

## 2020-11-10 DIAGNOSIS — F0280 Dementia in other diseases classified elsewhere without behavioral disturbance: Secondary | ICD-10-CM | POA: Diagnosis not present

## 2020-11-10 DIAGNOSIS — G301 Alzheimer's disease with late onset: Secondary | ICD-10-CM | POA: Diagnosis not present

## 2020-11-10 DIAGNOSIS — I2510 Atherosclerotic heart disease of native coronary artery without angina pectoris: Secondary | ICD-10-CM | POA: Diagnosis not present

## 2020-11-10 DIAGNOSIS — S32592D Other specified fracture of left pubis, subsequent encounter for fracture with routine healing: Secondary | ICD-10-CM | POA: Diagnosis not present

## 2020-11-10 DIAGNOSIS — F32A Depression, unspecified: Secondary | ICD-10-CM | POA: Diagnosis not present

## 2020-11-10 DIAGNOSIS — S32512A Fracture of superior rim of left pubis, initial encounter for closed fracture: Secondary | ICD-10-CM | POA: Diagnosis not present

## 2020-11-10 DIAGNOSIS — S32028D Other fracture of second lumbar vertebra, subsequent encounter for fracture with routine healing: Secondary | ICD-10-CM | POA: Diagnosis not present

## 2020-11-10 DIAGNOSIS — I509 Heart failure, unspecified: Secondary | ICD-10-CM | POA: Diagnosis not present

## 2020-11-10 DIAGNOSIS — S3210XD Unspecified fracture of sacrum, subsequent encounter for fracture with routine healing: Secondary | ICD-10-CM | POA: Diagnosis not present

## 2020-11-10 DIAGNOSIS — S22088D Other fracture of T11-T12 vertebra, subsequent encounter for fracture with routine healing: Secondary | ICD-10-CM | POA: Diagnosis not present

## 2020-11-10 DIAGNOSIS — G609 Hereditary and idiopathic neuropathy, unspecified: Secondary | ICD-10-CM | POA: Diagnosis not present

## 2020-11-10 DIAGNOSIS — I480 Paroxysmal atrial fibrillation: Secondary | ICD-10-CM | POA: Diagnosis not present

## 2020-11-10 DIAGNOSIS — I25810 Atherosclerosis of coronary artery bypass graft(s) without angina pectoris: Secondary | ICD-10-CM | POA: Diagnosis not present

## 2020-11-10 DIAGNOSIS — S32038D Other fracture of third lumbar vertebra, subsequent encounter for fracture with routine healing: Secondary | ICD-10-CM | POA: Diagnosis not present

## 2020-11-10 DIAGNOSIS — S32511D Fracture of superior rim of right pubis, subsequent encounter for fracture with routine healing: Secondary | ICD-10-CM | POA: Diagnosis not present

## 2020-11-17 DIAGNOSIS — R443 Hallucinations, unspecified: Secondary | ICD-10-CM | POA: Diagnosis not present

## 2020-11-17 DIAGNOSIS — R32 Unspecified urinary incontinence: Secondary | ICD-10-CM | POA: Diagnosis not present

## 2020-11-17 DIAGNOSIS — G300 Alzheimer's disease with early onset: Secondary | ICD-10-CM | POA: Diagnosis not present

## 2020-11-17 DIAGNOSIS — F0280 Dementia in other diseases classified elsewhere without behavioral disturbance: Secondary | ICD-10-CM | POA: Diagnosis not present

## 2020-11-17 DIAGNOSIS — I10 Essential (primary) hypertension: Secondary | ICD-10-CM | POA: Diagnosis not present

## 2020-12-20 DIAGNOSIS — Z950 Presence of cardiac pacemaker: Secondary | ICD-10-CM | POA: Diagnosis not present

## 2020-12-20 DIAGNOSIS — N390 Urinary tract infection, site not specified: Secondary | ICD-10-CM | POA: Diagnosis not present

## 2021-01-22 DIAGNOSIS — H35433 Paving stone degeneration of retina, bilateral: Secondary | ICD-10-CM | POA: Diagnosis not present

## 2021-01-22 DIAGNOSIS — H35363 Drusen (degenerative) of macula, bilateral: Secondary | ICD-10-CM | POA: Diagnosis not present

## 2021-01-22 DIAGNOSIS — H43813 Vitreous degeneration, bilateral: Secondary | ICD-10-CM | POA: Diagnosis not present

## 2021-01-22 DIAGNOSIS — H31012 Macula scars of posterior pole (postinflammatory) (post-traumatic), left eye: Secondary | ICD-10-CM | POA: Diagnosis not present

## 2021-01-22 DIAGNOSIS — Z961 Presence of intraocular lens: Secondary | ICD-10-CM | POA: Diagnosis not present

## 2021-01-22 DIAGNOSIS — D3131 Benign neoplasm of right choroid: Secondary | ICD-10-CM | POA: Diagnosis not present

## 2021-01-22 DIAGNOSIS — H52203 Unspecified astigmatism, bilateral: Secondary | ICD-10-CM | POA: Diagnosis not present

## 2021-01-22 DIAGNOSIS — H353132 Nonexudative age-related macular degeneration, bilateral, intermediate dry stage: Secondary | ICD-10-CM | POA: Diagnosis not present

## 2021-01-22 DIAGNOSIS — H524 Presbyopia: Secondary | ICD-10-CM | POA: Diagnosis not present

## 2021-03-22 DIAGNOSIS — Z950 Presence of cardiac pacemaker: Secondary | ICD-10-CM | POA: Diagnosis not present

## 2021-04-19 DIAGNOSIS — H903 Sensorineural hearing loss, bilateral: Secondary | ICD-10-CM | POA: Diagnosis not present

## 2021-04-19 DIAGNOSIS — H6123 Impacted cerumen, bilateral: Secondary | ICD-10-CM | POA: Diagnosis not present

## 2021-04-25 DIAGNOSIS — K219 Gastro-esophageal reflux disease without esophagitis: Secondary | ICD-10-CM | POA: Diagnosis not present

## 2021-04-25 DIAGNOSIS — I480 Paroxysmal atrial fibrillation: Secondary | ICD-10-CM | POA: Diagnosis not present

## 2021-04-25 DIAGNOSIS — G609 Hereditary and idiopathic neuropathy, unspecified: Secondary | ICD-10-CM | POA: Diagnosis not present

## 2021-04-25 DIAGNOSIS — E876 Hypokalemia: Secondary | ICD-10-CM | POA: Diagnosis not present

## 2021-04-25 DIAGNOSIS — Z13220 Encounter for screening for lipoid disorders: Secondary | ICD-10-CM | POA: Diagnosis not present

## 2021-04-25 DIAGNOSIS — F0281 Dementia in other diseases classified elsewhere with behavioral disturbance: Secondary | ICD-10-CM | POA: Diagnosis not present

## 2021-04-25 DIAGNOSIS — D508 Other iron deficiency anemias: Secondary | ICD-10-CM | POA: Diagnosis not present

## 2021-04-25 DIAGNOSIS — E559 Vitamin D deficiency, unspecified: Secondary | ICD-10-CM | POA: Diagnosis not present

## 2021-04-25 DIAGNOSIS — H903 Sensorineural hearing loss, bilateral: Secondary | ICD-10-CM | POA: Diagnosis not present

## 2021-04-25 DIAGNOSIS — E871 Hypo-osmolality and hyponatremia: Secondary | ICD-10-CM | POA: Diagnosis not present

## 2021-04-25 DIAGNOSIS — L989 Disorder of the skin and subcutaneous tissue, unspecified: Secondary | ICD-10-CM | POA: Diagnosis not present

## 2021-04-25 DIAGNOSIS — Z1329 Encounter for screening for other suspected endocrine disorder: Secondary | ICD-10-CM | POA: Diagnosis not present

## 2021-04-25 DIAGNOSIS — Z951 Presence of aortocoronary bypass graft: Secondary | ICD-10-CM | POA: Diagnosis not present

## 2021-04-25 DIAGNOSIS — K649 Unspecified hemorrhoids: Secondary | ICD-10-CM | POA: Diagnosis not present

## 2021-04-25 DIAGNOSIS — E782 Mixed hyperlipidemia: Secondary | ICD-10-CM | POA: Diagnosis not present

## 2021-04-25 DIAGNOSIS — M810 Age-related osteoporosis without current pathological fracture: Secondary | ICD-10-CM | POA: Diagnosis not present

## 2021-04-25 DIAGNOSIS — I495 Sick sinus syndrome: Secondary | ICD-10-CM | POA: Diagnosis not present

## 2021-04-25 DIAGNOSIS — I25810 Atherosclerosis of coronary artery bypass graft(s) without angina pectoris: Secondary | ICD-10-CM | POA: Diagnosis not present

## 2021-04-25 DIAGNOSIS — I10 Essential (primary) hypertension: Secondary | ICD-10-CM | POA: Diagnosis not present

## 2021-04-25 DIAGNOSIS — G301 Alzheimer's disease with late onset: Secondary | ICD-10-CM | POA: Diagnosis not present

## 2021-04-25 DIAGNOSIS — Z950 Presence of cardiac pacemaker: Secondary | ICD-10-CM | POA: Diagnosis not present

## 2021-04-25 DIAGNOSIS — Z0000 Encounter for general adult medical examination without abnormal findings: Secondary | ICD-10-CM | POA: Diagnosis not present

## 2021-05-02 DIAGNOSIS — I480 Paroxysmal atrial fibrillation: Secondary | ICD-10-CM | POA: Diagnosis not present

## 2021-05-02 DIAGNOSIS — E782 Mixed hyperlipidemia: Secondary | ICD-10-CM | POA: Diagnosis not present

## 2021-05-02 DIAGNOSIS — I25810 Atherosclerosis of coronary artery bypass graft(s) without angina pectoris: Secondary | ICD-10-CM | POA: Diagnosis not present

## 2021-05-02 DIAGNOSIS — H903 Sensorineural hearing loss, bilateral: Secondary | ICD-10-CM | POA: Diagnosis not present

## 2021-05-02 DIAGNOSIS — F0281 Dementia in other diseases classified elsewhere with behavioral disturbance: Secondary | ICD-10-CM | POA: Diagnosis not present

## 2021-05-02 DIAGNOSIS — D509 Iron deficiency anemia, unspecified: Secondary | ICD-10-CM | POA: Diagnosis not present

## 2021-05-02 DIAGNOSIS — E871 Hypo-osmolality and hyponatremia: Secondary | ICD-10-CM | POA: Diagnosis not present

## 2021-05-02 DIAGNOSIS — K219 Gastro-esophageal reflux disease without esophagitis: Secondary | ICD-10-CM | POA: Diagnosis not present

## 2021-05-02 DIAGNOSIS — I495 Sick sinus syndrome: Secondary | ICD-10-CM | POA: Diagnosis not present

## 2021-05-02 DIAGNOSIS — Z950 Presence of cardiac pacemaker: Secondary | ICD-10-CM | POA: Diagnosis not present

## 2021-05-02 DIAGNOSIS — I10 Essential (primary) hypertension: Secondary | ICD-10-CM | POA: Diagnosis not present

## 2021-05-02 DIAGNOSIS — Z951 Presence of aortocoronary bypass graft: Secondary | ICD-10-CM | POA: Diagnosis not present

## 2021-05-02 DIAGNOSIS — G301 Alzheimer's disease with late onset: Secondary | ICD-10-CM | POA: Diagnosis not present

## 2021-05-02 DIAGNOSIS — Z0000 Encounter for general adult medical examination without abnormal findings: Secondary | ICD-10-CM | POA: Diagnosis not present

## 2021-05-02 DIAGNOSIS — E559 Vitamin D deficiency, unspecified: Secondary | ICD-10-CM | POA: Diagnosis not present

## 2021-05-07 DIAGNOSIS — D485 Neoplasm of uncertain behavior of skin: Secondary | ICD-10-CM | POA: Diagnosis not present

## 2021-05-07 DIAGNOSIS — L820 Inflamed seborrheic keratosis: Secondary | ICD-10-CM | POA: Diagnosis not present

## 2021-05-30 DIAGNOSIS — Z961 Presence of intraocular lens: Secondary | ICD-10-CM | POA: Diagnosis not present

## 2021-05-30 DIAGNOSIS — H3554 Dystrophies primarily involving the retinal pigment epithelium: Secondary | ICD-10-CM | POA: Diagnosis not present

## 2021-05-30 DIAGNOSIS — H35361 Drusen (degenerative) of macula, right eye: Secondary | ICD-10-CM | POA: Diagnosis not present

## 2021-05-30 DIAGNOSIS — D3131 Benign neoplasm of right choroid: Secondary | ICD-10-CM | POA: Diagnosis not present

## 2021-05-30 DIAGNOSIS — H35371 Puckering of macula, right eye: Secondary | ICD-10-CM | POA: Diagnosis not present

## 2021-05-30 DIAGNOSIS — H353132 Nonexudative age-related macular degeneration, bilateral, intermediate dry stage: Secondary | ICD-10-CM | POA: Diagnosis not present

## 2021-05-30 DIAGNOSIS — H34831 Tributary (branch) retinal vein occlusion, right eye, with macular edema: Secondary | ICD-10-CM | POA: Diagnosis not present

## 2021-05-30 DIAGNOSIS — H43813 Vitreous degeneration, bilateral: Secondary | ICD-10-CM | POA: Diagnosis not present

## 2021-07-06 DIAGNOSIS — S2242XA Multiple fractures of ribs, left side, initial encounter for closed fracture: Secondary | ICD-10-CM | POA: Diagnosis not present

## 2021-07-06 DIAGNOSIS — M19011 Primary osteoarthritis, right shoulder: Secondary | ICD-10-CM | POA: Diagnosis not present

## 2021-07-06 DIAGNOSIS — I700 Atherosclerosis of aorta: Secondary | ICD-10-CM | POA: Diagnosis not present

## 2021-07-06 DIAGNOSIS — M1611 Unilateral primary osteoarthritis, right hip: Secondary | ICD-10-CM | POA: Diagnosis not present

## 2021-07-06 DIAGNOSIS — F0281 Dementia in other diseases classified elsewhere with behavioral disturbance: Secondary | ICD-10-CM | POA: Diagnosis not present

## 2021-07-06 DIAGNOSIS — I517 Cardiomegaly: Secondary | ICD-10-CM | POA: Diagnosis not present

## 2021-07-06 DIAGNOSIS — M79671 Pain in right foot: Secondary | ICD-10-CM | POA: Diagnosis not present

## 2021-07-06 DIAGNOSIS — S4991XA Unspecified injury of right shoulder and upper arm, initial encounter: Secondary | ICD-10-CM | POA: Diagnosis not present

## 2021-07-06 DIAGNOSIS — R52 Pain, unspecified: Secondary | ICD-10-CM | POA: Diagnosis not present

## 2021-07-06 DIAGNOSIS — D509 Iron deficiency anemia, unspecified: Secondary | ICD-10-CM | POA: Diagnosis not present

## 2021-07-06 DIAGNOSIS — S32511A Fracture of superior rim of right pubis, initial encounter for closed fracture: Secondary | ICD-10-CM | POA: Diagnosis not present

## 2021-07-06 DIAGNOSIS — K449 Diaphragmatic hernia without obstruction or gangrene: Secondary | ICD-10-CM | POA: Diagnosis not present

## 2021-07-06 DIAGNOSIS — M85871 Other specified disorders of bone density and structure, right ankle and foot: Secondary | ICD-10-CM | POA: Diagnosis not present

## 2021-07-06 DIAGNOSIS — E876 Hypokalemia: Secondary | ICD-10-CM | POA: Diagnosis not present

## 2021-07-06 DIAGNOSIS — E871 Hypo-osmolality and hyponatremia: Secondary | ICD-10-CM | POA: Diagnosis not present

## 2021-07-06 DIAGNOSIS — S32000A Wedge compression fracture of unspecified lumbar vertebra, initial encounter for closed fracture: Secondary | ICD-10-CM | POA: Diagnosis not present

## 2021-07-06 DIAGNOSIS — M545 Low back pain, unspecified: Secondary | ICD-10-CM | POA: Diagnosis not present

## 2021-07-06 DIAGNOSIS — M810 Age-related osteoporosis without current pathological fracture: Secondary | ICD-10-CM | POA: Diagnosis not present

## 2021-07-06 DIAGNOSIS — G301 Alzheimer's disease with late onset: Secondary | ICD-10-CM | POA: Diagnosis not present

## 2021-07-06 DIAGNOSIS — S7291XA Unspecified fracture of right femur, initial encounter for closed fracture: Secondary | ICD-10-CM | POA: Diagnosis not present

## 2021-07-06 DIAGNOSIS — W19XXXA Unspecified fall, initial encounter: Secondary | ICD-10-CM | POA: Diagnosis not present

## 2021-07-06 DIAGNOSIS — M7731 Calcaneal spur, right foot: Secondary | ICD-10-CM | POA: Diagnosis not present

## 2021-07-06 DIAGNOSIS — M25511 Pain in right shoulder: Secondary | ICD-10-CM | POA: Diagnosis not present

## 2021-07-06 DIAGNOSIS — S51011A Laceration without foreign body of right elbow, initial encounter: Secondary | ICD-10-CM | POA: Diagnosis not present

## 2021-07-06 DIAGNOSIS — S2241XA Multiple fractures of ribs, right side, initial encounter for closed fracture: Secondary | ICD-10-CM | POA: Diagnosis not present

## 2021-07-06 DIAGNOSIS — M25561 Pain in right knee: Secondary | ICD-10-CM | POA: Diagnosis not present

## 2021-07-06 DIAGNOSIS — S32591A Other specified fracture of right pubis, initial encounter for closed fracture: Secondary | ICD-10-CM | POA: Diagnosis not present

## 2021-07-10 DIAGNOSIS — I959 Hypotension, unspecified: Secondary | ICD-10-CM | POA: Diagnosis not present

## 2021-07-10 DIAGNOSIS — E871 Hypo-osmolality and hyponatremia: Secondary | ICD-10-CM | POA: Diagnosis not present

## 2021-07-10 DIAGNOSIS — R600 Localized edema: Secondary | ICD-10-CM | POA: Diagnosis not present

## 2021-07-13 DIAGNOSIS — S32511D Fracture of superior rim of right pubis, subsequent encounter for fracture with routine healing: Secondary | ICD-10-CM | POA: Diagnosis not present

## 2021-07-13 DIAGNOSIS — S2249XD Multiple fractures of ribs, unspecified side, subsequent encounter for fracture with routine healing: Secondary | ICD-10-CM | POA: Diagnosis not present

## 2021-07-13 DIAGNOSIS — M810 Age-related osteoporosis without current pathological fracture: Secondary | ICD-10-CM | POA: Diagnosis not present

## 2021-07-13 DIAGNOSIS — R262 Difficulty in walking, not elsewhere classified: Secondary | ICD-10-CM | POA: Diagnosis not present

## 2021-07-13 DIAGNOSIS — E871 Hypo-osmolality and hyponatremia: Secondary | ICD-10-CM | POA: Diagnosis not present

## 2021-07-13 DIAGNOSIS — D509 Iron deficiency anemia, unspecified: Secondary | ICD-10-CM | POA: Diagnosis not present

## 2021-07-13 DIAGNOSIS — F0281 Dementia in other diseases classified elsewhere with behavioral disturbance: Secondary | ICD-10-CM | POA: Diagnosis not present

## 2021-07-13 DIAGNOSIS — G301 Alzheimer's disease with late onset: Secondary | ICD-10-CM | POA: Diagnosis not present

## 2021-07-14 ENCOUNTER — Emergency Department (HOSPITAL_BASED_OUTPATIENT_CLINIC_OR_DEPARTMENT_OTHER): Payer: PPO

## 2021-07-14 ENCOUNTER — Emergency Department (HOSPITAL_BASED_OUTPATIENT_CLINIC_OR_DEPARTMENT_OTHER)
Admission: EM | Admit: 2021-07-14 | Discharge: 2021-07-14 | Disposition: A | Discharge status: Home / Self Care | Payer: PPO | Attending: Emergency Medicine | Admitting: Emergency Medicine

## 2021-07-14 ENCOUNTER — Encounter (HOSPITAL_BASED_OUTPATIENT_CLINIC_OR_DEPARTMENT_OTHER): Payer: Self-pay | Admitting: Emergency Medicine

## 2021-07-14 DIAGNOSIS — S59911A Unspecified injury of right forearm, initial encounter: Secondary | ICD-10-CM | POA: Diagnosis present

## 2021-07-14 DIAGNOSIS — M25512 Pain in left shoulder: Secondary | ICD-10-CM | POA: Insufficient documentation

## 2021-07-14 DIAGNOSIS — R410 Disorientation, unspecified: Secondary | ICD-10-CM | POA: Diagnosis not present

## 2021-07-14 DIAGNOSIS — S299XXA Unspecified injury of thorax, initial encounter: Secondary | ICD-10-CM | POA: Diagnosis not present

## 2021-07-14 DIAGNOSIS — S51811A Laceration without foreign body of right forearm, initial encounter: Secondary | ICD-10-CM | POA: Diagnosis not present

## 2021-07-14 DIAGNOSIS — Z7401 Bed confinement status: Secondary | ICD-10-CM | POA: Diagnosis not present

## 2021-07-14 DIAGNOSIS — I700 Atherosclerosis of aorta: Secondary | ICD-10-CM | POA: Diagnosis not present

## 2021-07-14 DIAGNOSIS — Z950 Presence of cardiac pacemaker: Secondary | ICD-10-CM | POA: Insufficient documentation

## 2021-07-14 DIAGNOSIS — I469 Cardiac arrest, cause unspecified: Secondary | ICD-10-CM | POA: Diagnosis not present

## 2021-07-14 DIAGNOSIS — M549 Dorsalgia, unspecified: Secondary | ICD-10-CM | POA: Diagnosis not present

## 2021-07-14 DIAGNOSIS — I517 Cardiomegaly: Secondary | ICD-10-CM | POA: Diagnosis not present

## 2021-07-14 DIAGNOSIS — S2242XD Multiple fractures of ribs, left side, subsequent encounter for fracture with routine healing: Secondary | ICD-10-CM | POA: Diagnosis not present

## 2021-07-14 DIAGNOSIS — W19XXXA Unspecified fall, initial encounter: Secondary | ICD-10-CM | POA: Diagnosis not present

## 2021-07-14 DIAGNOSIS — R296 Repeated falls: Secondary | ICD-10-CM | POA: Diagnosis not present

## 2021-07-14 DIAGNOSIS — F0390 Unspecified dementia without behavioral disturbance: Secondary | ICD-10-CM | POA: Diagnosis not present

## 2021-07-14 DIAGNOSIS — Z043 Encounter for examination and observation following other accident: Secondary | ICD-10-CM | POA: Diagnosis not present

## 2021-07-14 DIAGNOSIS — Z79899 Other long term (current) drug therapy: Secondary | ICD-10-CM | POA: Insufficient documentation

## 2021-07-14 DIAGNOSIS — W06XXXA Fall from bed, initial encounter: Secondary | ICD-10-CM | POA: Diagnosis not present

## 2021-07-14 DIAGNOSIS — M25511 Pain in right shoulder: Secondary | ICD-10-CM | POA: Insufficient documentation

## 2021-07-14 DIAGNOSIS — J90 Pleural effusion, not elsewhere classified: Secondary | ICD-10-CM | POA: Diagnosis not present

## 2021-07-14 DIAGNOSIS — I10 Essential (primary) hypertension: Secondary | ICD-10-CM | POA: Insufficient documentation

## 2021-07-14 DIAGNOSIS — I959 Hypotension, unspecified: Secondary | ICD-10-CM | POA: Diagnosis not present

## 2021-07-14 DIAGNOSIS — S22080A Wedge compression fracture of T11-T12 vertebra, initial encounter for closed fracture: Secondary | ICD-10-CM | POA: Diagnosis not present

## 2021-07-14 DIAGNOSIS — M47814 Spondylosis without myelopathy or radiculopathy, thoracic region: Secondary | ICD-10-CM | POA: Diagnosis not present

## 2021-07-14 HISTORY — DX: Old myocardial infarction: I25.2

## 2021-07-14 LAB — URINALYSIS, ROUTINE W REFLEX MICROSCOPIC
Bilirubin Urine: NEGATIVE
Glucose, UA: NEGATIVE mg/dL
Ketones, ur: NEGATIVE mg/dL
Leukocytes,Ua: NEGATIVE
Nitrite: NEGATIVE
Protein, ur: NEGATIVE mg/dL
Specific Gravity, Urine: 1.02 (ref 1.005–1.030)
pH: 7 (ref 5.0–8.0)

## 2021-07-14 LAB — CBC WITH DIFFERENTIAL/PLATELET
Abs Immature Granulocytes: 0.03 10*3/uL (ref 0.00–0.07)
Basophils Absolute: 0 10*3/uL (ref 0.0–0.1)
Basophils Relative: 1 %
Eosinophils Absolute: 0.1 10*3/uL (ref 0.0–0.5)
Eosinophils Relative: 2 %
HCT: 38 % (ref 36.0–46.0)
Hemoglobin: 12.7 g/dL (ref 12.0–15.0)
Immature Granulocytes: 1 %
Lymphocytes Relative: 13 %
Lymphs Abs: 0.8 10*3/uL (ref 0.7–4.0)
MCH: 27.4 pg (ref 26.0–34.0)
MCHC: 33.4 g/dL (ref 30.0–36.0)
MCV: 82.1 fL (ref 80.0–100.0)
Monocytes Absolute: 0.6 10*3/uL (ref 0.1–1.0)
Monocytes Relative: 10 %
Neutro Abs: 4.5 10*3/uL (ref 1.7–7.7)
Neutrophils Relative %: 73 %
Platelets: 194 10*3/uL (ref 150–400)
RBC: 4.63 MIL/uL (ref 3.87–5.11)
RDW: 18.6 % — ABNORMAL HIGH (ref 11.5–15.5)
WBC: 6 10*3/uL (ref 4.0–10.5)
nRBC: 0 % (ref 0.0–0.2)

## 2021-07-14 LAB — NA AND K (SODIUM & POTASSIUM), RAND UR
Potassium Urine: 25 mmol/L
Sodium, Ur: 104 mmol/L

## 2021-07-14 LAB — COMPREHENSIVE METABOLIC PANEL
ALT: 15 U/L (ref 0–44)
AST: 19 U/L (ref 15–41)
Albumin: 4.1 g/dL (ref 3.5–5.0)
Alkaline Phosphatase: 171 U/L — ABNORMAL HIGH (ref 38–126)
Anion gap: 9 (ref 5–15)
BUN: 12 mg/dL (ref 8–23)
CO2: 27 mmol/L (ref 22–32)
Calcium: 9.3 mg/dL (ref 8.9–10.3)
Chloride: 87 mmol/L — ABNORMAL LOW (ref 98–111)
Creatinine, Ser: 0.45 mg/dL (ref 0.44–1.00)
GFR, Estimated: >60 mL/min (ref >60)
Glucose, Bld: 93 mg/dL (ref 70–99)
Potassium: 3.9 mmol/L (ref 3.5–5.1)
Sodium: 123 mmol/L — ABNORMAL LOW (ref 135–145)
Total Bilirubin: 1 mg/dL (ref 0.3–1.2)
Total Protein: 6.9 g/dL (ref 6.5–8.1)

## 2021-07-14 LAB — MAGNESIUM: Magnesium: 2.1 mg/dL (ref 1.7–2.4)

## 2021-07-14 LAB — BASIC METABOLIC PANEL
Anion gap: 7 (ref 5–15)
BUN: 9 mg/dL (ref 8–23)
CO2: 26 mmol/L (ref 22–32)
Calcium: 8.1 mg/dL — ABNORMAL LOW (ref 8.9–10.3)
Chloride: 91 mmol/L — ABNORMAL LOW (ref 98–111)
Creatinine, Ser: 0.43 mg/dL — ABNORMAL LOW (ref 0.44–1.00)
GFR, Estimated: >60 mL/min (ref >60)
Glucose, Bld: 88 mg/dL (ref 70–99)
Potassium: 3.9 mmol/L (ref 3.5–5.1)
Sodium: 124 mmol/L — ABNORMAL LOW (ref 135–145)

## 2021-07-14 LAB — URINALYSIS, MICROSCOPIC (REFLEX)

## 2021-07-14 LAB — OSMOLALITY: Osmolality: 261 mOsm/kg — ABNORMAL LOW (ref 275–295)

## 2021-07-14 LAB — TSH: TSH: 1.204 u[IU]/mL (ref 0.350–4.500)

## 2021-07-14 LAB — OSMOLALITY, URINE: Osmolality, Ur: 373 mOsm/kg (ref 300–900)

## 2021-07-14 MED ORDER — NITROFURANTOIN MONOHYD MACRO 100 MG PO CAPS: 100.0000 mg | ORAL_CAPSULE | Freq: Two times a day (BID) | ORAL | 0 refills | Status: AC

## 2021-07-14 MED ORDER — SODIUM CHLORIDE 0.9 % IV BOLUS
500.0000 mL | Freq: Once | INTRAVENOUS | Status: AC
  Administered 2021-07-14: 500 mL via INTRAVENOUS

## 2021-07-14 MED ORDER — ACETAMINOPHEN 325 MG PO TABS
650.0000 mg | ORAL_TABLET | Freq: Once | ORAL | Status: AC
  Administered 2021-07-14: 650 mg via ORAL
  Filled 2021-07-14: qty 2

## 2021-07-14 MED ORDER — IOHEXOL 350 MG/ML SOLN
75.0000 mL | Freq: Once | INTRAVENOUS | Status: AC | PRN
  Administered 2021-07-14: 75 mL via INTRAVENOUS

## 2021-07-14 NOTE — ED Provider Notes (Signed)
[facility] Provider Note   CSN: [id number] Arrival date & time: 07/14/21  0750     History Chief Complaint  Patient presents with   Fall   Back Pain    Kristie Norris is a 87 y.o. female.  HPI Patient presents for pain between her shoulder blades following a fall.  She has a prior history of dementia, HTN, MI and pacemaker placement.  She lives at home with her husband, who is 49 years old, as well as her daughter.  Her daughter is her primary caregiver.  Her daughter accompanies her in the ED today.  She was recently found to have right sided pelvic fractures from a fall that occurred approximately 3 weeks ago.  Since that time, she has been nonambulatory.  Today, her daughter found her on the floor next to the bed.  She suspects that the patient attempted to get up out of bed on her own.  Patient was awake and alert at the time.  She endorsed a new pain that was located between her shoulder blades.  For this reason, EMS was called.  She was seen in PCP office 1 week ago after another fall.  At that time, she was found to have age-indeterminate right rib fractures.  Her sodium at the time was 123.  Since then, she has gone to the ED.  Sodium at that time was 124.  She was given IV fluid replacement with subsequent sodium of 126.  Per chart review, patient's baseline sodium is 130.  Sodium has been discussed with primary care doctor who feels that it may be related to Depakote.  They have discussed changing the dosing of the Depakote to every other day.  Patient's daughter states that the patient has a good appetite.  She has been providing her with rich salty foods.  She has not been restricting her water.    Past Medical History:  Diagnosis Date   Hypertension    MI, old    2012    There are no problems to display for this patient.   Past Surgical History:  Procedure Laterality Date   CARDIAC SURGERY     PACEMAKER INSERTION       OB History    No obstetric history on file.     History reviewed. No pertinent family history.     Home Medications Prior to Admission medications   Medication Sig Start Date End Date Taking? Authorizing Provider  calcium-vitamin D (OSCAL WITH D) 250-125 MG-UNIT tablet Take 1 tablet by mouth daily. Unsure of dose   Yes [provider]  cholecalciferol (VITAMIN D3) 25 MCG (1000 UNIT) tablet Take 1,000 Units by mouth daily. Unsure of dose   Yes [provider]  divalproex (DEPAKOTE) 125 MG DR tablet Take 125 mg by mouth 3 (three) times daily. Unsure of dose   Yes [provider]  donepezil (ARICEPT) 10 MG tablet Take 10 mg by mouth at bedtime. Unsure of dose   Yes [provider]  magnesium 30 MG tablet Take 30 mg by mouth 2 (two) times daily. Unsure of dose   Yes [provider]  metoprolol tartrate (LOPRESSOR) 25 MG tablet Take 25 mg by mouth 2 (two) times daily. Unsure of dose   Yes [provider]  montelukast (SINGULAIR) 10 MG tablet Take 10 mg by mouth at bedtime. Unsure of dose   Yes [provider]  nitrofurantoin, macrocrystal-monohydrate, (MACROBID) 100 MG capsule Take 1 capsule (100  mg total) by mouth 2 (two) times daily. 07/14/21  Yes Godfrey Pick, MD  omeprazole (PRILOSEC) 10 MG capsule Take 10 mg by mouth daily. Unsure of dose   Yes [provider]  potassium chloride (KLOR-CON) 20 MEQ packet Take by mouth 2 (two) times daily. Unsure of dose   Yes [provider]  vitamin B-12 (CYANOCOBALAMIN) 500 MCG tablet Take 500 mcg by mouth daily. Unsure of dose   Yes [provider]  vitamin E 200 UNIT capsule Take 200 Units by mouth daily. Unsure of dose   Yes [provider]    Allergies    Penicillins, Sulfa antibiotics, Codeine, and Amlodipine  Review of Systems   Review of Systems  Constitutional:  Negative for activity change, appetite change, chills, fatigue and fever.  HENT:  Negative for ear  pain and sore throat.   Eyes:  Negative for pain and visual disturbance.  Respiratory:  Negative for cough, chest tightness and shortness of breath.   Cardiovascular:  Negative for chest pain and palpitations.  Gastrointestinal:  Negative for abdominal pain, diarrhea, nausea and vomiting.  Genitourinary:  Negative for dysuria, flank pain, hematuria and pelvic pain.  Musculoskeletal:  Positive for back pain and gait problem (chronic). Negative for arthralgias, joint swelling, neck pain and neck stiffness.  Skin:  Positive for wound. Negative for color change and rash.  Neurological:  Negative for dizziness, seizures, syncope, weakness, numbness and headaches.  Hematological:  Does not bruise/bleed easily.  Psychiatric/Behavioral:  Positive for confusion (chronic).   All other systems reviewed and are negative.  Physical Exam Updated Vital Signs BP (!) 154/67   Pulse 72   Temp 97.6 F (36.4 C) (Oral)   Resp 16   SpO2 93%   Physical Exam Vitals and nursing note reviewed.  Constitutional:      General: She is not in acute distress.    Appearance: She is well-developed. She is not toxic-appearing or diaphoretic.  HENT:     Head: Normocephalic and atraumatic.     Right Ear: External ear normal.     Left Ear: External ear normal.     Nose: Nose normal.     Mouth/Throat:     Mouth: Mucous membranes are moist.  Eyes:     Extraocular Movements: Extraocular movements intact.     Conjunctiva/sclera: Conjunctivae normal.  Cardiovascular:     Rate and Rhythm: Normal rate and regular rhythm.     Heart sounds: No murmur heard. Pulmonary:     Effort: Pulmonary effort is normal. No respiratory distress.     Breath sounds: Normal breath sounds. No wheezing or rales.  Chest:     Chest wall: No tenderness.  Abdominal:     Palpations: Abdomen is soft.     Tenderness: There is no abdominal tenderness.  Musculoskeletal:        General: Tenderness (upper thoracic back) present. No swelling or  deformity. Normal range of motion.     Cervical back: Normal range of motion and neck supple. No rigidity or tenderness.     Right lower leg: No edema.     Left lower leg: No edema.  Skin:    General: Skin is warm and dry.     Coloration: Skin is not pale.     Findings: Bruising present.     Comments: New skin tear to R forearm  Neurological:     General: No focal deficit present.     Mental Status: She is alert. Mental status  is at baseline.     Cranial Nerves: Cranial nerves are intact. No cranial nerve deficit, dysarthria or facial asymmetry.     Sensory: Sensation is intact. No sensory deficit.     Motor: No weakness, abnormal muscle tone or pronator drift.     Coordination: Coordination is intact.  Psychiatric:        Mood and Affect: Mood normal.        Behavior: Behavior normal.        Cognition and Memory: Memory is impaired (baseline).    ED Results / Procedures / Treatments   Labs (all labs ordered are listed, but only abnormal results are displayed) Labs Reviewed  CBC WITH DIFFERENTIAL/PLATELET - Abnormal; Notable for the following components:      Result Value   RDW 18.6 (*)    All other components within normal limits  COMPREHENSIVE METABOLIC PANEL - Abnormal; Notable for the following components:   Sodium 123 (*)    Chloride 87 (*)    Alkaline Phosphatase 171 (*)    All other components within normal limits  BASIC METABOLIC PANEL - Abnormal; Notable for the following components:   Sodium 124 (*)    Chloride 91 (*)    Creatinine, Ser 0.43 (*)    Calcium 8.1 (*)    All other components within normal limits  URINALYSIS, ROUTINE W REFLEX MICROSCOPIC - Abnormal; Notable for the following components:   APPearance CLOUDY (*)    Hgb urine dipstick SMALL (*)    All other components within normal limits  OSMOLALITY - Abnormal; Notable for the following components:   Osmolality 261 (*)    All other components within normal limits  URINALYSIS, MICROSCOPIC (REFLEX) -  Abnormal; Notable for the following components:   Bacteria, UA MANY (*)    All other components within normal limits  URINE CULTURE  MAGNESIUM  OSMOLALITY, URINE  NA AND K (SODIUM & POTASSIUM), RAND UR  TSH    EKG EKG Interpretation  Date/Time:  Saturday July 14 2021 08:04:17 EDT Ventricular Rate:  72 PR Interval:  181 QRS Duration: 110 QT Interval:  405 QTC Calculation: 444 R Axis:   -45 Text Interpretation: Sinus or ectopic atrial rhythm Atrial premature complex Incomplete left bundle branch block LVH with secondary repolarization abnormality Anterior Q waves, possibly due to LVH Confirmed by Godfrey Pick ([id number]) on 07/14/2021 10:06:13 AM  Radiology DG Chest 2 View  Result Date: 07/14/2021 CLINICAL DATA:  Unwitnessed fall. EXAM: CHEST - 2 VIEW COMPARISON:  July 06, 2021 FINDINGS: Stable pacemaker. Stable cardiomegaly. The hila and mediastinum are unchanged. No pneumothorax. Bibasilar opacities, likely atelectasis. Anterior wedging of T11 was also seen on the CT scan from October 2021. Loss of height of L1 was also present in 2021. Evaluation of the spine is otherwise limited. IMPRESSION: 1. Evaluation of the spine is limited as above. 2. Bibasilar opacities, likely atelectasis. 3. No other abnormalities. Electronically Signed   By: Dorise Bullion III M.D.   On: 07/14/2021 09:37   CT CHEST W CONTRAST  Result Date: 07/14/2021 CLINICAL DATA:  Chest trauma. Aortic injury suspected. History of multiple falls. EXAM: CT CHEST WITH CONTRAST TECHNIQUE: Multidetector CT imaging of the chest was performed during intravenous contrast administration. CONTRAST:  58mL OMNIPAQUE IOHEXOL 350 MG/ML SOLN COMPARISON:  CT of the chest abdomen and pelvis on 08/07/2020 FINDINGS: Cardiovascular: Heart is enlarged with dilated LEFT atrium as before. There is dense atherosclerotic calcification of the coronary arteries. There is dense atherosclerosis of the  thoracic aorta. Aorta is tortuous as before. The  contrast bolus timing suboptimally opacifies the aorta. However, there is no evidence for dissection or traumatic aneurysm. Ascending aorta measures 4.2 centimeters. The aortic arch is 3.6 centimeters. Descending aorta is 4.3 centimeters. Previously ascending aorta was 3.1 centimeters, aortic arch 3.3 centimeters, and descending aorta 3.5 centimeters. Patient has a LEFT subclavian approach AICD, with leads to the RIGHT ventricle. 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 There are numerous collaterals in the LEFT chest wall and mediastinum. Pulmonary arterial enlargement, consistent with pulmonary hypertension. Mediastinum/Nodes: Heterogeneous appearance of the thyroid gland. Moderate hiatal hernia. No retroperitoneal or mesenteric adenopathy. Lungs/Pleura: Bilateral pleural effusions, LEFT greater than RIGHT. There is prominence of pulmonary vascularity. Patchy opacities in the lung bases are consistent with scarring or infiltrate, similar in appearance to prior study. Upper Abdomen: There is dense atherosclerosis of the abdominal aorta, associated with tortuous bilateral iliac vessels. Extensive colonic diverticulosis. Musculoskeletal: There is an acute fracture of the RIGHT LATERAL fourth rib (image 71 of series 3). There is deformity of numerous bilateral ribs, consistent with previous fractures, age indeterminate. Remote compression fracture of T11. IMPRESSION: 1. Fusiform dilatation of the thoracic aorta, with tortuous course. Aorta measures slightly larger when compared with prior study from 08/07/2020. Recommend annual imaging followup by CTA or MRA. This recommendation follows 2010 ACCF/AHA/AATS/ACR/ASA/SCA/SCAI/SIR/STS/SVM Guidelines for the Diagnosis and Management of Patients with Thoracic Aortic Disease. Circulation. 2010; 121: N629-B284. Aortic aneurysm NOS (ICD10-I71.9) 2.  Aortic Atherosclerosis (ICD10-I70.0). Aortic aneurysm NOS (ICD10-I71.9). 3. Contrast bolus timing suboptimally opacifies the  thoracic aorta. However, no evidence for aortic injury. 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 5. Pulmonary arterial hypertension. 6. Bilateral pleural effusions and bibasilar opacities, consistent with scarring or infiltrate. 7. Chronic bilateral rib fractures. Acute fracture of the RIGHT LATERAL fourth rib. 8. Remote, stable T11 compression fracture. Electronically Signed   By: Nolon Nations M.D.   On: 07/14/2021 12:26   CT T-SPINE NO CHARGE  Result Date: 07/14/2021 CLINICAL DATA:  Multiple falls with history of rib fracture. EXAM: CT Thoracic spine with contrast TECHNIQUE: Multiplanar CT images of the thoracic spine were reconstructed from contemporary CT of the Chest. CONTRAST:  None additional COMPARISON:  None FINDINGS: CT THORACIC SPINE FINDINGS Alignment: No traumatic malalignment Vertebrae: T11 compression fracture, nonacute based on margins of the fracture but incompletely healed with a partially gas containing cleft through the dominant fracture plane. Advanced height loss with complete collapse centrally. No significant retropulsion. Remote L1, L2, and L3 compression fractures as visualized. No acute fracture is noted. No evidence of aggressive bone lesion. Generalized osteopenia. Healing left ninth, tenth, eleventh, and twelfth rib head/neck fractures. Paraspinal and other soft tissues: Reported separately Disc levels: Ordinary degenerative changes for age. No visible cord impingement. IMPRESSION: 1. Nonacute but unhealed T11 compression fracture with advanced height loss. The fracture cleft contains gas, suggesting chronic motion. 2. Remote L1, L2, and L3 compression fractures. 3. Healing left ninth through twelfth rib head/neck fractures. Electronically Signed   By: Jorje Guild M.D.   On: 07/14/2021 12:05    Procedures Procedures   Medications Ordered in ED Medications  sodium chloride 0.9 % bolus 500 mL (0 mLs Intravenous Stopped  07/14/21 1104)  iohexol (OMNIPAQUE) 350 MG/ML injection 75 mL (75 mLs Intravenous Contrast Given 07/14/21 1130)  acetaminophen (TYLENOL) tablet 650 mg (650 mg Oral Given 07/14/21 1335)    ED Course  I have reviewed the triage vital signs and the nursing  notes.  Pertinent labs & imaging results that were available during my care of the patient were reviewed by me and considered in my medical decision making (see chart for details).    MDM Rules/Calculators/A&P                          Patient is a pleasant 87 year old female who presents from home by EMS after a fall.  Fall was unwitnessed.  She was found by her daughter who suspects that she attempted to get out of bed on her own.  Patient has been nonambulatory for the past 3 weeks following another fall and some right-sided pelvic injuries.  Patient's daughter has been coordinating with primary care doctor to get physical therapy to the home.  Currently, patient's daughter is the sole caregiver of her mother and father.  Patient frequently tells her daughter that she does not want to go to the hospital.  Today, she was agreeable to EMS transport which did raise concern of injury.  On arrival in the ED, patient is awake and alert.  She has dementia and hearing loss at baseline.  She is able to answer basic questions.  She endorses pain in the area of her back between her shoulder blades.  This area is tender on exam.  She denies any other areas of discomfort.  She is found to have old bruising on her legs and arms along with a new skin tear to her left forearm.  Skin tear was dressed in the ED.  Initial work-up consisted of lab work and x-ray imaging.  X-rays did not show any acute injuries.  On reassessment, patient continued to endorse similar pain between her shoulder blades.  CT imaging of chest and thoracic spine was ordered.  Results of initial lab work were notable for sodium of 123.  This appears to be in line with the patient's recent sodium  levels over the past week.  Prior lab work showed a baseline sodium of 130.  500 cc bolus of normal saline was ordered.  Tylenol was given for analgesia.  Results of CT scans showed multiple old injuries as well as 1 acute fracture of the lateral right fourth rib.  The patient does have a fusiform dilatation of the thoracic aorta with a tortuous course.  This has been seen on prior imaging.  Aorta measures slightly larger when compared to prior studies.  There is not evidence of aortic injury.  Radiology recommendations is for annual imaging follow-up.  Despite new rib fracture, patient's breathing is unlabored.  SPO2 is normal on room air.  On reassessments, patient was continually found to be resting comfortably.  Following IV fluid bolus, patient's serum sodium increased slightly to 124.  I had multiple shared decision making discussions with the patient's daughter.  Given her low sodium, patient would have a medical reason to be admitted to the hospital, and this was offered to the patient's daughter.  Patient's daughter is aware that her mother would be more comfortable at home.  She considered admission and discussed it with her brother.  Following this discussion, patient's daughter felt more comfortable bringing the patient home today.  Given the chronicity of the patient's hyponatremia, it is less likely to cause any symptoms.  Patient does have access to good follow-up.  Patient's daughter was advised that she can try fluid restriction at home and get repeat lab work within the next 2 days to assess for any changes.  Urine studies were ordered to assist in PCP follow-up for possible causes of the hyponatremia.  Patient's daughter was advised that she can return to the ED at any time if she changes her mind about getting her mother admitted.  Urinalysis did show evidence of urine infection.  Given her allergy profile, patient was prescribed nitrofurantoin.  Patient was discharged in stable  condition.  Final Clinical Impression(s) / ED Diagnoses Final diagnoses:  Fall, initial encounter    Rx / DC Orders ED Discharge Orders          Ordered    nitrofurantoin, macrocrystal-monohydrate, (MACROBID) 100 MG capsule  2 times daily        07/14/21 1505             Godfrey Pick, MD 07/14/21 2306

## 2021-07-14 NOTE — ED Notes (Signed)
PTAR at bedside 

## 2021-07-14 NOTE — ED Notes (Signed)
Daughter at bedside.

## 2021-07-14 NOTE — ED Triage Notes (Signed)
Per EMS. Pt from home. Pt had an unwitnessed fall last night when she got out of bed. Daughter told EMS that she has had confusion (seeing people that aren't there) for the past few months. Had a hip fracture a month ago that did not require surgical treatment. Pt reports pain between shoulder blades today. Pt not on blood thinners. Pt hard of hearing. Daughter on her way to the ED.

## 2021-07-14 NOTE — ED Notes (Signed)
ED Provider at bedside. 

## 2021-07-14 NOTE — ED Notes (Signed)
EDP at bedside  

## 2021-07-14 NOTE — ED Notes (Signed)
[facility] contacted about transport home.

## 2021-07-16 LAB — URINE CULTURE: Culture: >=100000 — AB

## 2021-07-17 ENCOUNTER — Telehealth: Payer: Self-pay

## 2021-07-17 DIAGNOSIS — W19XXXD Unspecified fall, subsequent encounter: Secondary | ICD-10-CM | POA: Diagnosis not present

## 2021-07-17 DIAGNOSIS — Z79891 Long term (current) use of opiate analgesic: Secondary | ICD-10-CM | POA: Diagnosis not present

## 2021-07-17 DIAGNOSIS — M80051D Age-related osteoporosis with current pathological fracture, right femur, subsequent encounter for fracture with routine healing: Secondary | ICD-10-CM | POA: Diagnosis not present

## 2021-07-17 DIAGNOSIS — M800AXD Age-related osteoporosis with current pathological fracture, other site, subsequent encounter for fracture with routine healing: Secondary | ICD-10-CM | POA: Diagnosis not present

## 2021-07-17 DIAGNOSIS — F0280 Dementia in other diseases classified elsewhere without behavioral disturbance: Secondary | ICD-10-CM | POA: Diagnosis not present

## 2021-07-17 DIAGNOSIS — G301 Alzheimer's disease with late onset: Secondary | ICD-10-CM | POA: Diagnosis not present

## 2021-07-17 DIAGNOSIS — E871 Hypo-osmolality and hyponatremia: Secondary | ICD-10-CM | POA: Diagnosis not present

## 2021-07-17 DIAGNOSIS — D649 Anemia, unspecified: Secondary | ICD-10-CM | POA: Diagnosis not present

## 2021-07-17 DIAGNOSIS — Z9181 History of falling: Secondary | ICD-10-CM | POA: Diagnosis not present

## 2021-07-17 DIAGNOSIS — I10 Essential (primary) hypertension: Secondary | ICD-10-CM | POA: Diagnosis not present

## 2021-07-17 DIAGNOSIS — Z993 Dependence on wheelchair: Secondary | ICD-10-CM | POA: Diagnosis not present

## 2021-07-17 DIAGNOSIS — G309 Alzheimer's disease, unspecified: Secondary | ICD-10-CM | POA: Diagnosis not present

## 2021-07-17 NOTE — Telephone Encounter (Signed)
Post ED Visit - Positive Culture Follow-up  Culture report reviewed by antimicrobial stewardship pharmacist: [facility] [x]  Joetta Manners, Pharm.D. []  Heide Guile, Pharm.D., BCPS AQ-ID []  Parks Neptune, Pharm.D., BCPS []  Alycia Rossetti, Pharm.D., BCPS []  [place], Pharm.D., BCPS, AAHIVP []  Legrand Como, Pharm.D., BCPS, AAHIVP []  Salome Arnt, PharmD, BCPS []  Johnnette Gourd, PharmD, BCPS []  Hughes Better, PharmD, BCPS []  Leeroy Cha, PharmD []  Laqueta Linden, PharmD, BCPS []  Albertina Parr, PharmD  [place] Team []  Leodis Sias, PharmD []  Lindell Spar, PharmD []  Royetta Asal, PharmD []  Graylin Shiver, Rph []  Rema Fendt) Glennon Mac, PharmD []  Arlyn Dunning, PharmD []  Netta Cedars, PharmD []  Dia Sitter, PharmD []  Leone Haven, PharmD []  Gretta Arab, PharmD []  Theodis Shove, PharmD []  Peggyann Juba, PharmD []  Reuel Boom, PharmD   Positive urine culture Treated with Nitrofurantoin Monohyd Macro, organism sensitive to the same and no further patient follow-up is required at this time.  Glennon Hamilton 07/17/2021, 12:02 PM

## 2021-07-20 DIAGNOSIS — I10 Essential (primary) hypertension: Secondary | ICD-10-CM | POA: Diagnosis not present

## 2021-07-20 DIAGNOSIS — E871 Hypo-osmolality and hyponatremia: Secondary | ICD-10-CM | POA: Diagnosis not present

## 2021-07-27 DIAGNOSIS — M80051D Age-related osteoporosis with current pathological fracture, right femur, subsequent encounter for fracture with routine healing: Secondary | ICD-10-CM | POA: Diagnosis not present

## 2021-07-27 DIAGNOSIS — Z79891 Long term (current) use of opiate analgesic: Secondary | ICD-10-CM | POA: Diagnosis not present

## 2021-07-27 DIAGNOSIS — W19XXXD Unspecified fall, subsequent encounter: Secondary | ICD-10-CM | POA: Diagnosis not present

## 2021-07-27 DIAGNOSIS — I10 Essential (primary) hypertension: Secondary | ICD-10-CM | POA: Diagnosis not present

## 2021-07-27 DIAGNOSIS — G301 Alzheimer's disease with late onset: Secondary | ICD-10-CM | POA: Diagnosis not present

## 2021-07-27 DIAGNOSIS — G309 Alzheimer's disease, unspecified: Secondary | ICD-10-CM | POA: Diagnosis not present

## 2021-07-27 DIAGNOSIS — E871 Hypo-osmolality and hyponatremia: Secondary | ICD-10-CM | POA: Diagnosis not present

## 2021-07-27 DIAGNOSIS — D649 Anemia, unspecified: Secondary | ICD-10-CM | POA: Diagnosis not present

## 2021-07-27 DIAGNOSIS — F0280 Dementia in other diseases classified elsewhere without behavioral disturbance: Secondary | ICD-10-CM | POA: Diagnosis not present

## 2021-07-27 DIAGNOSIS — Z993 Dependence on wheelchair: Secondary | ICD-10-CM | POA: Diagnosis not present

## 2021-07-27 DIAGNOSIS — M800AXD Age-related osteoporosis with current pathological fracture, other site, subsequent encounter for fracture with routine healing: Secondary | ICD-10-CM | POA: Diagnosis not present

## 2021-07-27 DIAGNOSIS — Z9181 History of falling: Secondary | ICD-10-CM | POA: Diagnosis not present

## 2021-08-03 DIAGNOSIS — M80051D Age-related osteoporosis with current pathological fracture, right femur, subsequent encounter for fracture with routine healing: Secondary | ICD-10-CM | POA: Diagnosis not present

## 2021-08-03 DIAGNOSIS — W19XXXD Unspecified fall, subsequent encounter: Secondary | ICD-10-CM | POA: Diagnosis not present

## 2021-08-03 DIAGNOSIS — F0280 Dementia in other diseases classified elsewhere without behavioral disturbance: Secondary | ICD-10-CM | POA: Diagnosis not present

## 2021-08-03 DIAGNOSIS — G309 Alzheimer's disease, unspecified: Secondary | ICD-10-CM | POA: Diagnosis not present

## 2021-08-03 DIAGNOSIS — M800AXD Age-related osteoporosis with current pathological fracture, other site, subsequent encounter for fracture with routine healing: Secondary | ICD-10-CM | POA: Diagnosis not present

## 2021-08-03 DIAGNOSIS — Z993 Dependence on wheelchair: Secondary | ICD-10-CM | POA: Diagnosis not present

## 2021-08-03 DIAGNOSIS — I10 Essential (primary) hypertension: Secondary | ICD-10-CM | POA: Diagnosis not present

## 2021-08-03 DIAGNOSIS — Z9181 History of falling: Secondary | ICD-10-CM | POA: Diagnosis not present

## 2021-08-03 DIAGNOSIS — E871 Hypo-osmolality and hyponatremia: Secondary | ICD-10-CM | POA: Diagnosis not present

## 2021-08-03 DIAGNOSIS — Z79891 Long term (current) use of opiate analgesic: Secondary | ICD-10-CM | POA: Diagnosis not present

## 2021-08-03 DIAGNOSIS — G301 Alzheimer's disease with late onset: Secondary | ICD-10-CM | POA: Diagnosis not present

## 2021-08-03 DIAGNOSIS — D649 Anemia, unspecified: Secondary | ICD-10-CM | POA: Diagnosis not present

## 2021-08-13 DIAGNOSIS — M800AXD Age-related osteoporosis with current pathological fracture, other site, subsequent encounter for fracture with routine healing: Secondary | ICD-10-CM | POA: Diagnosis not present

## 2021-08-13 DIAGNOSIS — G309 Alzheimer's disease, unspecified: Secondary | ICD-10-CM | POA: Diagnosis not present

## 2021-08-13 DIAGNOSIS — Z993 Dependence on wheelchair: Secondary | ICD-10-CM | POA: Diagnosis not present

## 2021-08-13 DIAGNOSIS — I10 Essential (primary) hypertension: Secondary | ICD-10-CM | POA: Diagnosis not present

## 2021-08-13 DIAGNOSIS — D649 Anemia, unspecified: Secondary | ICD-10-CM | POA: Diagnosis not present

## 2021-08-13 DIAGNOSIS — F0280 Dementia in other diseases classified elsewhere without behavioral disturbance: Secondary | ICD-10-CM | POA: Diagnosis not present

## 2021-08-13 DIAGNOSIS — Z9181 History of falling: Secondary | ICD-10-CM | POA: Diagnosis not present

## 2021-08-13 DIAGNOSIS — W19XXXD Unspecified fall, subsequent encounter: Secondary | ICD-10-CM | POA: Diagnosis not present

## 2021-08-13 DIAGNOSIS — Z79891 Long term (current) use of opiate analgesic: Secondary | ICD-10-CM | POA: Diagnosis not present

## 2021-08-13 DIAGNOSIS — G301 Alzheimer's disease with late onset: Secondary | ICD-10-CM | POA: Diagnosis not present

## 2021-08-13 DIAGNOSIS — E871 Hypo-osmolality and hyponatremia: Secondary | ICD-10-CM | POA: Diagnosis not present

## 2021-08-13 DIAGNOSIS — M80051D Age-related osteoporosis with current pathological fracture, right femur, subsequent encounter for fracture with routine healing: Secondary | ICD-10-CM | POA: Diagnosis not present

## 2021-08-15 DIAGNOSIS — Z9181 History of falling: Secondary | ICD-10-CM | POA: Diagnosis not present

## 2021-08-15 DIAGNOSIS — M80051D Age-related osteoporosis with current pathological fracture, right femur, subsequent encounter for fracture with routine healing: Secondary | ICD-10-CM | POA: Diagnosis not present

## 2021-08-15 DIAGNOSIS — D649 Anemia, unspecified: Secondary | ICD-10-CM | POA: Diagnosis not present

## 2021-08-15 DIAGNOSIS — Z79891 Long term (current) use of opiate analgesic: Secondary | ICD-10-CM | POA: Diagnosis not present

## 2021-08-15 DIAGNOSIS — M800AXD Age-related osteoporosis with current pathological fracture, other site, subsequent encounter for fracture with routine healing: Secondary | ICD-10-CM | POA: Diagnosis not present

## 2021-08-15 DIAGNOSIS — W19XXXD Unspecified fall, subsequent encounter: Secondary | ICD-10-CM | POA: Diagnosis not present

## 2021-08-15 DIAGNOSIS — G309 Alzheimer's disease, unspecified: Secondary | ICD-10-CM | POA: Diagnosis not present

## 2021-08-15 DIAGNOSIS — G301 Alzheimer's disease with late onset: Secondary | ICD-10-CM | POA: Diagnosis not present

## 2021-08-15 DIAGNOSIS — Z993 Dependence on wheelchair: Secondary | ICD-10-CM | POA: Diagnosis not present

## 2021-08-15 DIAGNOSIS — I10 Essential (primary) hypertension: Secondary | ICD-10-CM | POA: Diagnosis not present

## 2021-08-15 DIAGNOSIS — E871 Hypo-osmolality and hyponatremia: Secondary | ICD-10-CM | POA: Diagnosis not present

## 2021-08-15 DIAGNOSIS — F0280 Dementia in other diseases classified elsewhere without behavioral disturbance: Secondary | ICD-10-CM | POA: Diagnosis not present

## 2021-08-21 DIAGNOSIS — Z79891 Long term (current) use of opiate analgesic: Secondary | ICD-10-CM | POA: Diagnosis not present

## 2021-08-21 DIAGNOSIS — W19XXXD Unspecified fall, subsequent encounter: Secondary | ICD-10-CM | POA: Diagnosis not present

## 2021-08-21 DIAGNOSIS — M80051D Age-related osteoporosis with current pathological fracture, right femur, subsequent encounter for fracture with routine healing: Secondary | ICD-10-CM | POA: Diagnosis not present

## 2021-08-21 DIAGNOSIS — I10 Essential (primary) hypertension: Secondary | ICD-10-CM | POA: Diagnosis not present

## 2021-08-21 DIAGNOSIS — F0280 Dementia in other diseases classified elsewhere without behavioral disturbance: Secondary | ICD-10-CM | POA: Diagnosis not present

## 2021-08-21 DIAGNOSIS — G309 Alzheimer's disease, unspecified: Secondary | ICD-10-CM | POA: Diagnosis not present

## 2021-08-21 DIAGNOSIS — E871 Hypo-osmolality and hyponatremia: Secondary | ICD-10-CM | POA: Diagnosis not present

## 2021-08-21 DIAGNOSIS — Z993 Dependence on wheelchair: Secondary | ICD-10-CM | POA: Diagnosis not present

## 2021-08-21 DIAGNOSIS — Z9181 History of falling: Secondary | ICD-10-CM | POA: Diagnosis not present

## 2021-08-21 DIAGNOSIS — M800AXD Age-related osteoporosis with current pathological fracture, other site, subsequent encounter for fracture with routine healing: Secondary | ICD-10-CM | POA: Diagnosis not present

## 2021-08-21 DIAGNOSIS — G301 Alzheimer's disease with late onset: Secondary | ICD-10-CM | POA: Diagnosis not present

## 2021-08-21 DIAGNOSIS — D649 Anemia, unspecified: Secondary | ICD-10-CM | POA: Diagnosis not present

## 2021-08-27 DIAGNOSIS — Z7189 Other specified counseling: Secondary | ICD-10-CM | POA: Diagnosis not present

## 2021-08-27 DIAGNOSIS — I495 Sick sinus syndrome: Secondary | ICD-10-CM | POA: Diagnosis not present

## 2021-08-27 DIAGNOSIS — Z45018 Encounter for adjustment and management of other part of cardiac pacemaker: Secondary | ICD-10-CM | POA: Diagnosis not present

## 2021-08-31 DIAGNOSIS — I10 Essential (primary) hypertension: Secondary | ICD-10-CM | POA: Diagnosis not present

## 2021-08-31 DIAGNOSIS — E871 Hypo-osmolality and hyponatremia: Secondary | ICD-10-CM | POA: Diagnosis not present

## 2021-08-31 DIAGNOSIS — F02818 Dementia in other diseases classified elsewhere, unspecified severity, with other behavioral disturbance: Secondary | ICD-10-CM | POA: Diagnosis not present

## 2021-08-31 DIAGNOSIS — M810 Age-related osteoporosis without current pathological fracture: Secondary | ICD-10-CM | POA: Diagnosis not present

## 2021-08-31 DIAGNOSIS — G301 Alzheimer's disease with late onset: Secondary | ICD-10-CM | POA: Diagnosis not present

## 2021-08-31 DIAGNOSIS — R262 Difficulty in walking, not elsewhere classified: Secondary | ICD-10-CM | POA: Diagnosis not present

## 2021-08-31 DIAGNOSIS — E559 Vitamin D deficiency, unspecified: Secondary | ICD-10-CM | POA: Diagnosis not present

## 2021-08-31 DIAGNOSIS — D509 Iron deficiency anemia, unspecified: Secondary | ICD-10-CM | POA: Diagnosis not present

## 2021-09-05 DIAGNOSIS — M800AXD Age-related osteoporosis with current pathological fracture, other site, subsequent encounter for fracture with routine healing: Secondary | ICD-10-CM | POA: Diagnosis not present

## 2021-09-05 DIAGNOSIS — E871 Hypo-osmolality and hyponatremia: Secondary | ICD-10-CM | POA: Diagnosis not present

## 2021-09-05 DIAGNOSIS — M80051D Age-related osteoporosis with current pathological fracture, right femur, subsequent encounter for fracture with routine healing: Secondary | ICD-10-CM | POA: Diagnosis not present

## 2021-09-05 DIAGNOSIS — Z9181 History of falling: Secondary | ICD-10-CM | POA: Diagnosis not present

## 2021-09-05 DIAGNOSIS — F0280 Dementia in other diseases classified elsewhere without behavioral disturbance: Secondary | ICD-10-CM | POA: Diagnosis not present

## 2021-09-05 DIAGNOSIS — D649 Anemia, unspecified: Secondary | ICD-10-CM | POA: Diagnosis not present

## 2021-09-05 DIAGNOSIS — Z79891 Long term (current) use of opiate analgesic: Secondary | ICD-10-CM | POA: Diagnosis not present

## 2021-09-05 DIAGNOSIS — G301 Alzheimer's disease with late onset: Secondary | ICD-10-CM | POA: Diagnosis not present

## 2021-09-05 DIAGNOSIS — I10 Essential (primary) hypertension: Secondary | ICD-10-CM | POA: Diagnosis not present

## 2021-09-05 DIAGNOSIS — Z993 Dependence on wheelchair: Secondary | ICD-10-CM | POA: Diagnosis not present

## 2021-09-05 DIAGNOSIS — G309 Alzheimer's disease, unspecified: Secondary | ICD-10-CM | POA: Diagnosis not present

## 2021-09-05 DIAGNOSIS — W19XXXD Unspecified fall, subsequent encounter: Secondary | ICD-10-CM | POA: Diagnosis not present

## 2021-09-10 DIAGNOSIS — E871 Hypo-osmolality and hyponatremia: Secondary | ICD-10-CM | POA: Diagnosis not present

## 2021-09-10 DIAGNOSIS — D509 Iron deficiency anemia, unspecified: Secondary | ICD-10-CM | POA: Diagnosis not present

## 2021-09-10 DIAGNOSIS — E559 Vitamin D deficiency, unspecified: Secondary | ICD-10-CM | POA: Diagnosis not present

## 2021-09-14 DIAGNOSIS — E871 Hypo-osmolality and hyponatremia: Secondary | ICD-10-CM | POA: Diagnosis not present

## 2021-09-14 DIAGNOSIS — M800AXD Age-related osteoporosis with current pathological fracture, other site, subsequent encounter for fracture with routine healing: Secondary | ICD-10-CM | POA: Diagnosis not present

## 2021-09-14 DIAGNOSIS — Z993 Dependence on wheelchair: Secondary | ICD-10-CM | POA: Diagnosis not present

## 2021-09-14 DIAGNOSIS — Z79891 Long term (current) use of opiate analgesic: Secondary | ICD-10-CM | POA: Diagnosis not present

## 2021-09-14 DIAGNOSIS — W19XXXD Unspecified fall, subsequent encounter: Secondary | ICD-10-CM | POA: Diagnosis not present

## 2021-09-14 DIAGNOSIS — G309 Alzheimer's disease, unspecified: Secondary | ICD-10-CM | POA: Diagnosis not present

## 2021-09-14 DIAGNOSIS — Z9181 History of falling: Secondary | ICD-10-CM | POA: Diagnosis not present

## 2021-09-14 DIAGNOSIS — F0280 Dementia in other diseases classified elsewhere without behavioral disturbance: Secondary | ICD-10-CM | POA: Diagnosis not present

## 2021-09-14 DIAGNOSIS — M80051D Age-related osteoporosis with current pathological fracture, right femur, subsequent encounter for fracture with routine healing: Secondary | ICD-10-CM | POA: Diagnosis not present

## 2021-09-14 DIAGNOSIS — I10 Essential (primary) hypertension: Secondary | ICD-10-CM | POA: Diagnosis not present

## 2021-09-14 DIAGNOSIS — D649 Anemia, unspecified: Secondary | ICD-10-CM | POA: Diagnosis not present

## 2021-09-14 DIAGNOSIS — G301 Alzheimer's disease with late onset: Secondary | ICD-10-CM | POA: Diagnosis not present

## 2021-10-14 DEATH — deceased

## 2021-11-25 IMAGING — DX DG CHEST 2V
2 series · 3 of 3 positions shown · non-contrast
Comparison: July 06, 2021

CLINICAL DATA: Unwitnessed fall.

EXAM:
CHEST - 2 VIEW

[Series 2: chest lat · 0.14mm/px · 2 of 2 slices shown]
[im 1/2]
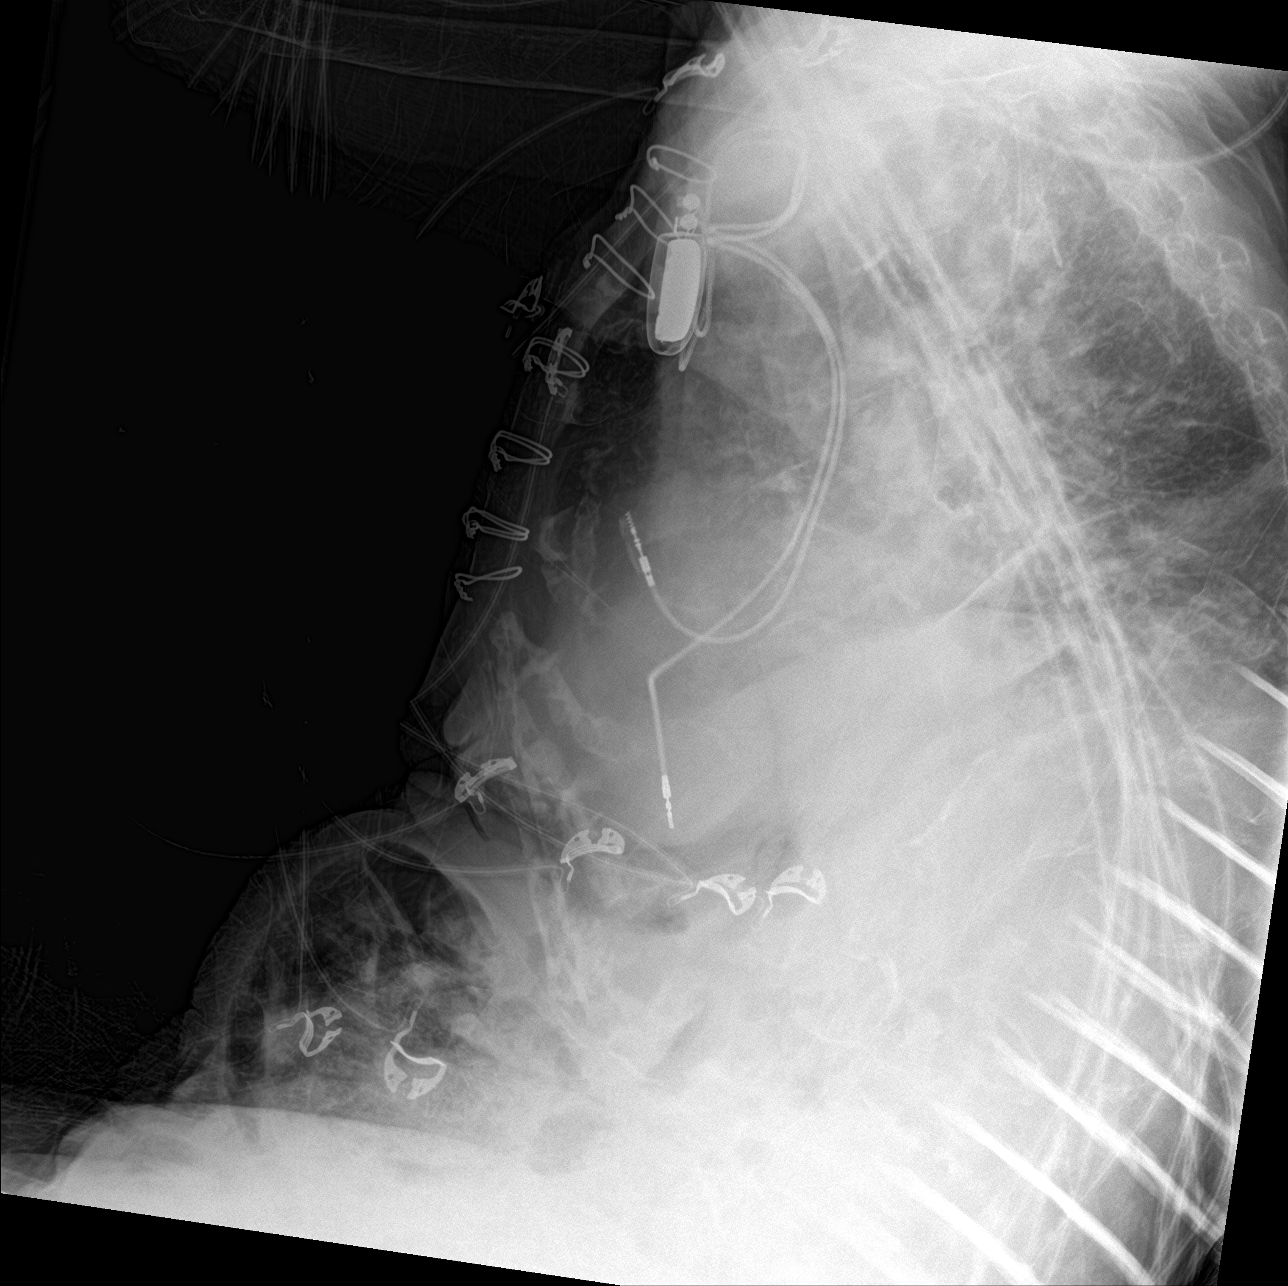
[im 2/2]
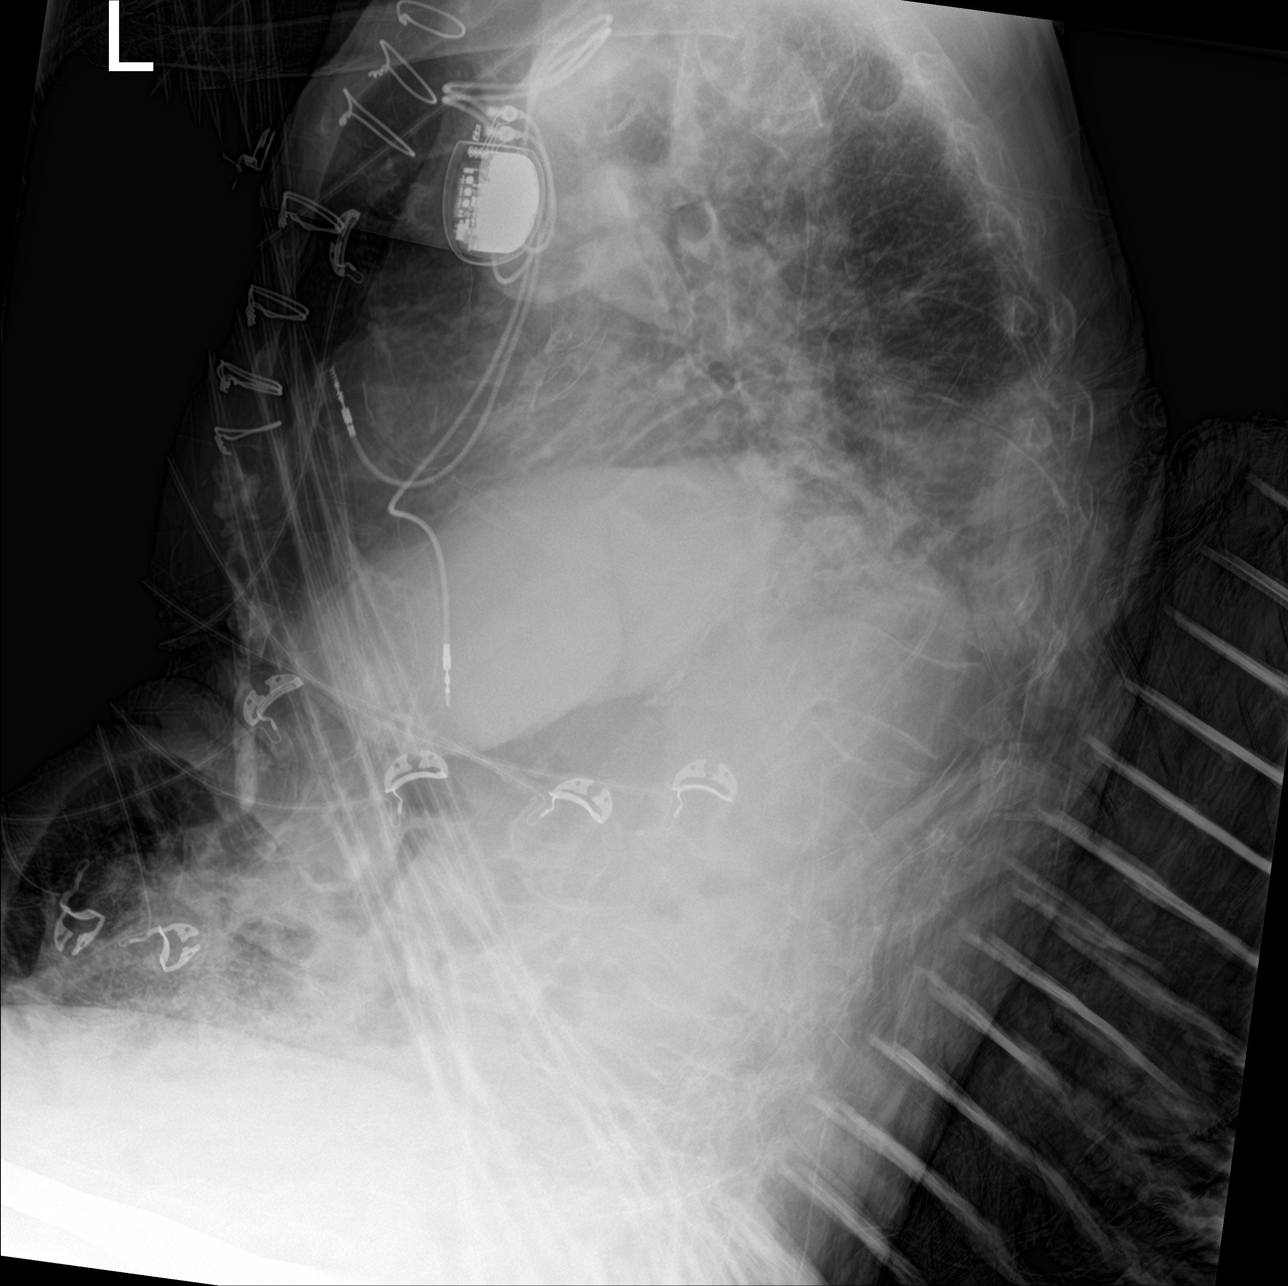

[chest ap strecther]
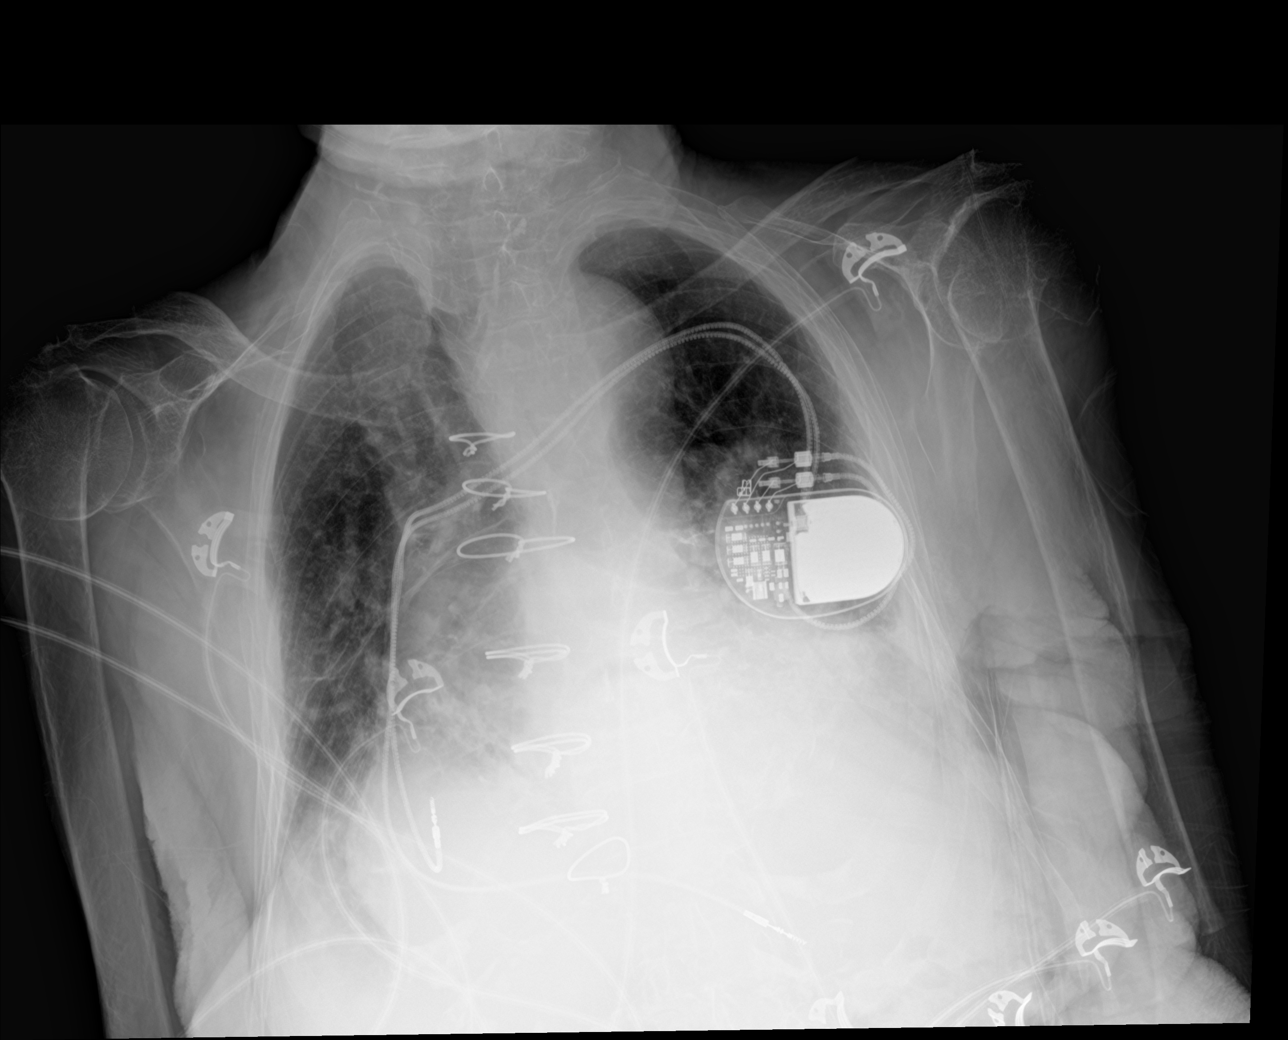

[3 of 3 positions shown; findings below may reference images not displayed]

FINDINGS: Stable pacemaker. Stable cardiomegaly. The hila and mediastinum are
unchanged. No pneumothorax. Bibasilar opacities, likely atelectasis.
Anterior wedging of T11 was also seen on the CT scan from July 2020. Loss of height of L1 was also present in 1714. Evaluation of
the spine is otherwise limited.
IMPRESSION: 1. Evaluation of the spine is limited as above.
2. Bibasilar opacities, likely atelectasis.
3. No other abnormalities.
